# Patient Record
Sex: Female | Born: 1965 | Hispanic: No | Marital: Married | Smoking: Never smoker
Health system: Southern US, Community
[De-identification: ages and names within clinical notes are randomized; demographics above are authoritative.]

## PROBLEM LIST (undated history)

## (undated) DIAGNOSIS — M1990 Unspecified osteoarthritis, unspecified site: Secondary | ICD-10-CM

## (undated) HISTORY — PX: AUGMENTATION MAMMAPLASTY: SUR837

## (undated) HISTORY — PX: BREAST BIOPSY: SHX20

---

## 1998-10-05 ENCOUNTER — Emergency Department (HOSPITAL_COMMUNITY): Admission: EM | Admit: 1998-10-05 | Discharge: 1998-10-05 | Payer: Self-pay | Admitting: Emergency Medicine

## 2001-06-27 ENCOUNTER — Emergency Department (HOSPITAL_COMMUNITY): Admission: EM | Admit: 2001-06-27 | Discharge: 2001-06-27 | Payer: Self-pay | Admitting: Emergency Medicine

## 2001-06-27 ENCOUNTER — Encounter: Payer: Self-pay | Admitting: Emergency Medicine

## 2002-05-29 ENCOUNTER — Other Ambulatory Visit: Admission: RE | Admit: 2002-05-29 | Discharge: 2002-05-29 | Payer: Self-pay | Admitting: Obstetrics and Gynecology

## 2003-06-29 ENCOUNTER — Other Ambulatory Visit: Admission: RE | Admit: 2003-06-29 | Discharge: 2003-06-29 | Payer: Self-pay | Admitting: Obstetrics and Gynecology

## 2004-07-28 ENCOUNTER — Other Ambulatory Visit: Admission: RE | Admit: 2004-07-28 | Discharge: 2004-07-28 | Payer: Self-pay | Admitting: Obstetrics and Gynecology

## 2014-03-05 ENCOUNTER — Other Ambulatory Visit: Payer: Self-pay | Admitting: Obstetrics and Gynecology

## 2014-03-05 DIAGNOSIS — R928 Other abnormal and inconclusive findings on diagnostic imaging of breast: Secondary | ICD-10-CM

## 2014-03-23 ENCOUNTER — Ambulatory Visit
Admission: RE | Admit: 2014-03-23 | Discharge: 2014-03-23 | Disposition: A | Discharge status: Home / Self Care | Payer: BC Managed Care – PPO | Source: Ambulatory Visit | Attending: Obstetrics and Gynecology | Admitting: Obstetrics and Gynecology

## 2014-03-23 ENCOUNTER — Other Ambulatory Visit: Payer: Self-pay | Admitting: Obstetrics and Gynecology

## 2014-03-23 DIAGNOSIS — R928 Other abnormal and inconclusive findings on diagnostic imaging of breast: Secondary | ICD-10-CM

## 2014-03-29 ENCOUNTER — Other Ambulatory Visit: Payer: Self-pay | Admitting: Obstetrics and Gynecology

## 2014-03-29 DIAGNOSIS — R928 Other abnormal and inconclusive findings on diagnostic imaging of breast: Secondary | ICD-10-CM

## 2014-04-01 ENCOUNTER — Inpatient Hospital Stay: Admission: RE | Admit: 2014-04-01 | Payer: BC Managed Care – PPO | Source: Ambulatory Visit

## 2014-04-14 ENCOUNTER — Ambulatory Visit
Admission: RE | Admit: 2014-04-14 | Discharge: 2014-04-14 | Disposition: A | Discharge status: Home / Self Care | Payer: BC Managed Care – PPO | Source: Ambulatory Visit | Attending: Obstetrics and Gynecology | Admitting: Obstetrics and Gynecology

## 2014-04-14 DIAGNOSIS — R928 Other abnormal and inconclusive findings on diagnostic imaging of breast: Secondary | ICD-10-CM

## 2014-09-02 ENCOUNTER — Other Ambulatory Visit: Payer: Self-pay | Admitting: Obstetrics and Gynecology

## 2014-09-03 LAB — CYTOLOGY - PAP

## 2015-05-06 ENCOUNTER — Other Ambulatory Visit: Payer: Self-pay | Admitting: Obstetrics and Gynecology

## 2015-05-06 DIAGNOSIS — D241 Benign neoplasm of right breast: Secondary | ICD-10-CM

## 2015-05-13 ENCOUNTER — Ambulatory Visit
Admission: RE | Admit: 2015-05-13 | Discharge: 2015-05-13 | Disposition: A | Discharge status: Home / Self Care | Payer: BLUE CROSS/BLUE SHIELD | Source: Ambulatory Visit | Attending: Obstetrics and Gynecology | Admitting: Obstetrics and Gynecology

## 2015-05-13 DIAGNOSIS — D241 Benign neoplasm of right breast: Secondary | ICD-10-CM

## 2015-09-01 DIAGNOSIS — M26609 Unspecified temporomandibular joint disorder, unspecified side: Secondary | ICD-10-CM | POA: Diagnosis not present

## 2016-06-12 DIAGNOSIS — Z1231 Encounter for screening mammogram for malignant neoplasm of breast: Secondary | ICD-10-CM | POA: Diagnosis not present

## 2016-06-12 DIAGNOSIS — Z6825 Body mass index (BMI) 25.0-25.9, adult: Secondary | ICD-10-CM | POA: Diagnosis not present

## 2016-06-12 DIAGNOSIS — Z01419 Encounter for gynecological examination (general) (routine) without abnormal findings: Secondary | ICD-10-CM | POA: Diagnosis not present

## 2016-11-29 DIAGNOSIS — M545 Low back pain: Secondary | ICD-10-CM | POA: Diagnosis not present

## 2017-06-17 DIAGNOSIS — Z1231 Encounter for screening mammogram for malignant neoplasm of breast: Secondary | ICD-10-CM | POA: Diagnosis not present

## 2017-06-17 DIAGNOSIS — Z01419 Encounter for gynecological examination (general) (routine) without abnormal findings: Secondary | ICD-10-CM | POA: Diagnosis not present

## 2017-06-17 DIAGNOSIS — Z6826 Body mass index (BMI) 26.0-26.9, adult: Secondary | ICD-10-CM | POA: Diagnosis not present

## 2017-07-15 DIAGNOSIS — M791 Myalgia, unspecified site: Secondary | ICD-10-CM | POA: Diagnosis not present

## 2017-07-15 DIAGNOSIS — R5383 Other fatigue: Secondary | ICD-10-CM | POA: Diagnosis not present

## 2017-07-15 DIAGNOSIS — M2550 Pain in unspecified joint: Secondary | ICD-10-CM | POA: Diagnosis not present

## 2017-07-17 DIAGNOSIS — H5203 Hypermetropia, bilateral: Secondary | ICD-10-CM | POA: Diagnosis not present

## 2018-01-21 DIAGNOSIS — M25532 Pain in left wrist: Secondary | ICD-10-CM | POA: Diagnosis not present

## 2018-01-21 DIAGNOSIS — M25531 Pain in right wrist: Secondary | ICD-10-CM | POA: Diagnosis not present

## 2018-01-21 DIAGNOSIS — M545 Low back pain: Secondary | ICD-10-CM | POA: Diagnosis not present

## 2018-04-28 DIAGNOSIS — M25531 Pain in right wrist: Secondary | ICD-10-CM | POA: Diagnosis not present

## 2018-04-28 DIAGNOSIS — M545 Low back pain: Secondary | ICD-10-CM | POA: Diagnosis not present

## 2018-04-28 DIAGNOSIS — M25532 Pain in left wrist: Secondary | ICD-10-CM | POA: Diagnosis not present

## 2018-06-06 ENCOUNTER — Encounter (INDEPENDENT_AMBULATORY_CARE_PROVIDER_SITE_OTHER): Payer: BLUE CROSS/BLUE SHIELD | Admitting: Neurology

## 2018-06-06 ENCOUNTER — Ambulatory Visit: Payer: BLUE CROSS/BLUE SHIELD | Admitting: Neurology

## 2018-06-06 DIAGNOSIS — M79641 Pain in right hand: Secondary | ICD-10-CM | POA: Diagnosis not present

## 2018-06-06 DIAGNOSIS — M79642 Pain in left hand: Secondary | ICD-10-CM

## 2018-06-06 DIAGNOSIS — Z0289 Encounter for other administrative examinations: Secondary | ICD-10-CM

## 2018-06-06 NOTE — Procedures (Signed)
Full Name: Marisa Bates Gender: Female MRN #: [id number] Date of Birth: 03/07/1966    Visit Date: 06/06/2018 10:47 Age: 53 Years 3 Months Old Examining Physician: Marcial Pacas, MD  Referring Physician: Jacelyn Grip, MD History: 53 years old female with bilateral thumb base tenderness, intermittent numbness,  Summary of the tests:  Nerve conduction study: Right median, ulnar, left median sensory responses were normal. Left median, right ulnar motor responses were normal. 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    ------------------------------- Marcial Pacas, M.D. PhD  [facility] [place], [place] [address] Tel: [phone] Fax: [phone]        [facility]    Nerve / Sites Muscle Latency Ref. Amplitude Ref. Rel Amp Segments Distance Velocity Ref. Area    ms ms mV mV %  cm m/s m/s mVms  R Median - APB     Wrist APB 3.8 ?4.4 4.8 ?4.0 100 Wrist - APB 7   16.4     Upper arm APB 7.4  6.2  130 Upper arm - Wrist 20 56 ?49 21.1     Axilla APB 4.4  2.0  33.1 Axilla - Upper arm    5.4     Upper arm       Axilla - Wrist      L Median - APB     Wrist APB 3.4 ?4.4 5.8 ?4.0 100 Wrist - APB 7   18.1     Upper arm APB 6.8  5.1  88.4 Upper arm - Wrist 20 58 ?49 17.0  R Ulnar - ADM     Wrist ADM 2.3 ?3.3 11.0 ?6.0 100 Wrist - ADM 7   26.0     B.Elbow ADM 4.9  7.4  67.3 B.Elbow - Wrist 16 63 ?49 20.1     A.Elbow ADM 6.5  7.0  95.3 A.Elbow - B.Elbow 10 64 ?49 19.3         A.Elbow - Wrist               SNC    Nerve / Sites Rec. Site Peak Lat Ref.  Amp Ref. Segments Distance    ms ms V V  cm  R Median - Orthodromic (Dig II, Mid palm)     Dig II Wrist 3.3 ?3.4 12 ?10 Dig II - Wrist 13  L Median - Orthodromic (Dig II, Mid palm)     Dig II Wrist 3.2 ?3.4  13 ?10 Dig II - Wrist 13  R Ulnar - Orthodromic, (Dig V, Mid palm)     Dig V Wrist 2.3 ?3.1 8 ?5 Dig V - Wrist 72           F  Wave    Nerve F Lat Ref.   ms ms  R Ulnar - ADM 22.7 ?32.0       EMG       EMG Summary Table    Spontaneous MUAP Recruitment  Muscle IA Fib PSW Fasc Other Amp Dur. Poly Pattern  R. First dorsal interosseous Normal None None None _______ Normal Normal Normal Normal  R. Abductor pollicis brevis Normal None None None _______ Normal Normal Normal Normal  R. Pronator teres Normal None None None _______ Normal Normal Normal Normal  R. Biceps brachii Normal None None None  _______ Normal Normal Normal Normal  R. Deltoid Normal None None None _______ Normal Normal Normal Normal  R. Triceps brachii Normal None None None _______ Normal Normal Normal Normal

## 2018-06-30 DIAGNOSIS — J0100 Acute maxillary sinusitis, unspecified: Secondary | ICD-10-CM | POA: Diagnosis not present

## 2018-07-07 DIAGNOSIS — Z6827 Body mass index (BMI) 27.0-27.9, adult: Secondary | ICD-10-CM | POA: Diagnosis not present

## 2018-07-07 DIAGNOSIS — Z01419 Encounter for gynecological examination (general) (routine) without abnormal findings: Secondary | ICD-10-CM | POA: Diagnosis not present

## 2018-07-07 DIAGNOSIS — Z1231 Encounter for screening mammogram for malignant neoplasm of breast: Secondary | ICD-10-CM | POA: Diagnosis not present

## 2018-11-11 DIAGNOSIS — R6883 Chills (without fever): Secondary | ICD-10-CM | POA: Diagnosis not present

## 2019-07-21 DIAGNOSIS — M545 Low back pain: Secondary | ICD-10-CM | POA: Diagnosis not present

## 2019-07-30 DIAGNOSIS — M545 Low back pain: Secondary | ICD-10-CM | POA: Diagnosis not present

## 2019-08-22 DIAGNOSIS — M48062 Spinal stenosis, lumbar region with neurogenic claudication: Secondary | ICD-10-CM | POA: Diagnosis not present

## 2019-09-04 DIAGNOSIS — M545 Low back pain: Secondary | ICD-10-CM | POA: Diagnosis not present

## 2019-09-04 DIAGNOSIS — M48062 Spinal stenosis, lumbar region with neurogenic claudication: Secondary | ICD-10-CM | POA: Diagnosis not present

## 2019-09-25 DIAGNOSIS — G894 Chronic pain syndrome: Secondary | ICD-10-CM | POA: Diagnosis not present

## 2019-09-25 DIAGNOSIS — M5136 Other intervertebral disc degeneration, lumbar region: Secondary | ICD-10-CM | POA: Diagnosis not present

## 2019-10-15 DIAGNOSIS — Z01419 Encounter for gynecological examination (general) (routine) without abnormal findings: Secondary | ICD-10-CM | POA: Diagnosis not present

## 2019-10-15 DIAGNOSIS — R635 Abnormal weight gain: Secondary | ICD-10-CM | POA: Diagnosis not present

## 2019-10-15 DIAGNOSIS — Z1231 Encounter for screening mammogram for malignant neoplasm of breast: Secondary | ICD-10-CM | POA: Diagnosis not present

## 2019-10-15 DIAGNOSIS — Z6827 Body mass index (BMI) 27.0-27.9, adult: Secondary | ICD-10-CM | POA: Diagnosis not present

## 2019-10-17 DIAGNOSIS — M5136 Other intervertebral disc degeneration, lumbar region: Secondary | ICD-10-CM | POA: Diagnosis not present

## 2019-11-02 DIAGNOSIS — M545 Low back pain: Secondary | ICD-10-CM | POA: Diagnosis not present

## 2019-11-14 DIAGNOSIS — M47816 Spondylosis without myelopathy or radiculopathy, lumbar region: Secondary | ICD-10-CM | POA: Diagnosis not present

## 2020-07-11 DIAGNOSIS — M545 Low back pain, unspecified: Secondary | ICD-10-CM | POA: Diagnosis not present

## 2020-07-29 DIAGNOSIS — M47816 Spondylosis without myelopathy or radiculopathy, lumbar region: Secondary | ICD-10-CM | POA: Diagnosis not present

## 2020-07-29 DIAGNOSIS — M5136 Other intervertebral disc degeneration, lumbar region: Secondary | ICD-10-CM | POA: Diagnosis not present

## 2020-08-15 DIAGNOSIS — M9903 Segmental and somatic dysfunction of lumbar region: Secondary | ICD-10-CM | POA: Diagnosis not present

## 2020-08-15 DIAGNOSIS — M9906 Segmental and somatic dysfunction of lower extremity: Secondary | ICD-10-CM | POA: Diagnosis not present

## 2020-08-15 DIAGNOSIS — M9902 Segmental and somatic dysfunction of thoracic region: Secondary | ICD-10-CM | POA: Diagnosis not present

## 2020-08-15 DIAGNOSIS — M9901 Segmental and somatic dysfunction of cervical region: Secondary | ICD-10-CM | POA: Diagnosis not present

## 2020-08-16 DIAGNOSIS — M9903 Segmental and somatic dysfunction of lumbar region: Secondary | ICD-10-CM | POA: Diagnosis not present

## 2020-08-16 DIAGNOSIS — M9902 Segmental and somatic dysfunction of thoracic region: Secondary | ICD-10-CM | POA: Diagnosis not present

## 2020-08-16 DIAGNOSIS — M9906 Segmental and somatic dysfunction of lower extremity: Secondary | ICD-10-CM | POA: Diagnosis not present

## 2020-08-16 DIAGNOSIS — M9901 Segmental and somatic dysfunction of cervical region: Secondary | ICD-10-CM | POA: Diagnosis not present

## 2020-08-19 DIAGNOSIS — M9906 Segmental and somatic dysfunction of lower extremity: Secondary | ICD-10-CM | POA: Diagnosis not present

## 2020-08-19 DIAGNOSIS — M9901 Segmental and somatic dysfunction of cervical region: Secondary | ICD-10-CM | POA: Diagnosis not present

## 2020-08-19 DIAGNOSIS — M9903 Segmental and somatic dysfunction of lumbar region: Secondary | ICD-10-CM | POA: Diagnosis not present

## 2020-08-19 DIAGNOSIS — M9902 Segmental and somatic dysfunction of thoracic region: Secondary | ICD-10-CM | POA: Diagnosis not present

## 2020-08-22 DIAGNOSIS — M9902 Segmental and somatic dysfunction of thoracic region: Secondary | ICD-10-CM | POA: Diagnosis not present

## 2020-08-22 DIAGNOSIS — M9906 Segmental and somatic dysfunction of lower extremity: Secondary | ICD-10-CM | POA: Diagnosis not present

## 2020-08-22 DIAGNOSIS — M9901 Segmental and somatic dysfunction of cervical region: Secondary | ICD-10-CM | POA: Diagnosis not present

## 2020-08-22 DIAGNOSIS — M9903 Segmental and somatic dysfunction of lumbar region: Secondary | ICD-10-CM | POA: Diagnosis not present

## 2020-08-23 DIAGNOSIS — M9906 Segmental and somatic dysfunction of lower extremity: Secondary | ICD-10-CM | POA: Diagnosis not present

## 2020-08-23 DIAGNOSIS — M9903 Segmental and somatic dysfunction of lumbar region: Secondary | ICD-10-CM | POA: Diagnosis not present

## 2020-08-23 DIAGNOSIS — M9901 Segmental and somatic dysfunction of cervical region: Secondary | ICD-10-CM | POA: Diagnosis not present

## 2020-08-23 DIAGNOSIS — M9902 Segmental and somatic dysfunction of thoracic region: Secondary | ICD-10-CM | POA: Diagnosis not present

## 2020-08-26 DIAGNOSIS — M9902 Segmental and somatic dysfunction of thoracic region: Secondary | ICD-10-CM | POA: Diagnosis not present

## 2020-08-26 DIAGNOSIS — M9906 Segmental and somatic dysfunction of lower extremity: Secondary | ICD-10-CM | POA: Diagnosis not present

## 2020-08-26 DIAGNOSIS — M9903 Segmental and somatic dysfunction of lumbar region: Secondary | ICD-10-CM | POA: Diagnosis not present

## 2020-08-26 DIAGNOSIS — M9901 Segmental and somatic dysfunction of cervical region: Secondary | ICD-10-CM | POA: Diagnosis not present

## 2020-08-29 DIAGNOSIS — M9903 Segmental and somatic dysfunction of lumbar region: Secondary | ICD-10-CM | POA: Diagnosis not present

## 2020-08-29 DIAGNOSIS — M9902 Segmental and somatic dysfunction of thoracic region: Secondary | ICD-10-CM | POA: Diagnosis not present

## 2020-08-29 DIAGNOSIS — M9901 Segmental and somatic dysfunction of cervical region: Secondary | ICD-10-CM | POA: Diagnosis not present

## 2020-08-29 DIAGNOSIS — M9906 Segmental and somatic dysfunction of lower extremity: Secondary | ICD-10-CM | POA: Diagnosis not present

## 2020-08-30 DIAGNOSIS — M9903 Segmental and somatic dysfunction of lumbar region: Secondary | ICD-10-CM | POA: Diagnosis not present

## 2020-08-30 DIAGNOSIS — M9901 Segmental and somatic dysfunction of cervical region: Secondary | ICD-10-CM | POA: Diagnosis not present

## 2020-08-30 DIAGNOSIS — M9902 Segmental and somatic dysfunction of thoracic region: Secondary | ICD-10-CM | POA: Diagnosis not present

## 2020-08-30 DIAGNOSIS — M9906 Segmental and somatic dysfunction of lower extremity: Secondary | ICD-10-CM | POA: Diagnosis not present

## 2020-09-02 DIAGNOSIS — M9901 Segmental and somatic dysfunction of cervical region: Secondary | ICD-10-CM | POA: Diagnosis not present

## 2020-09-02 DIAGNOSIS — M9906 Segmental and somatic dysfunction of lower extremity: Secondary | ICD-10-CM | POA: Diagnosis not present

## 2020-09-02 DIAGNOSIS — M9902 Segmental and somatic dysfunction of thoracic region: Secondary | ICD-10-CM | POA: Diagnosis not present

## 2020-09-02 DIAGNOSIS — M9903 Segmental and somatic dysfunction of lumbar region: Secondary | ICD-10-CM | POA: Diagnosis not present

## 2020-09-05 DIAGNOSIS — M9901 Segmental and somatic dysfunction of cervical region: Secondary | ICD-10-CM | POA: Diagnosis not present

## 2020-09-05 DIAGNOSIS — M9903 Segmental and somatic dysfunction of lumbar region: Secondary | ICD-10-CM | POA: Diagnosis not present

## 2020-09-05 DIAGNOSIS — M9902 Segmental and somatic dysfunction of thoracic region: Secondary | ICD-10-CM | POA: Diagnosis not present

## 2020-09-05 DIAGNOSIS — M9906 Segmental and somatic dysfunction of lower extremity: Secondary | ICD-10-CM | POA: Diagnosis not present

## 2020-09-06 DIAGNOSIS — M9903 Segmental and somatic dysfunction of lumbar region: Secondary | ICD-10-CM | POA: Diagnosis not present

## 2020-09-06 DIAGNOSIS — M9901 Segmental and somatic dysfunction of cervical region: Secondary | ICD-10-CM | POA: Diagnosis not present

## 2020-09-06 DIAGNOSIS — M9906 Segmental and somatic dysfunction of lower extremity: Secondary | ICD-10-CM | POA: Diagnosis not present

## 2020-09-06 DIAGNOSIS — M9902 Segmental and somatic dysfunction of thoracic region: Secondary | ICD-10-CM | POA: Diagnosis not present

## 2020-09-12 DIAGNOSIS — M9901 Segmental and somatic dysfunction of cervical region: Secondary | ICD-10-CM | POA: Diagnosis not present

## 2020-09-12 DIAGNOSIS — M9902 Segmental and somatic dysfunction of thoracic region: Secondary | ICD-10-CM | POA: Diagnosis not present

## 2020-09-12 DIAGNOSIS — M9903 Segmental and somatic dysfunction of lumbar region: Secondary | ICD-10-CM | POA: Diagnosis not present

## 2020-09-12 DIAGNOSIS — M9906 Segmental and somatic dysfunction of lower extremity: Secondary | ICD-10-CM | POA: Diagnosis not present

## 2020-09-16 DIAGNOSIS — M9902 Segmental and somatic dysfunction of thoracic region: Secondary | ICD-10-CM | POA: Diagnosis not present

## 2020-09-16 DIAGNOSIS — M9906 Segmental and somatic dysfunction of lower extremity: Secondary | ICD-10-CM | POA: Diagnosis not present

## 2020-09-16 DIAGNOSIS — M9903 Segmental and somatic dysfunction of lumbar region: Secondary | ICD-10-CM | POA: Diagnosis not present

## 2020-09-16 DIAGNOSIS — M9901 Segmental and somatic dysfunction of cervical region: Secondary | ICD-10-CM | POA: Diagnosis not present

## 2020-09-19 DIAGNOSIS — M9902 Segmental and somatic dysfunction of thoracic region: Secondary | ICD-10-CM | POA: Diagnosis not present

## 2020-09-19 DIAGNOSIS — M9903 Segmental and somatic dysfunction of lumbar region: Secondary | ICD-10-CM | POA: Diagnosis not present

## 2020-09-19 DIAGNOSIS — M9906 Segmental and somatic dysfunction of lower extremity: Secondary | ICD-10-CM | POA: Diagnosis not present

## 2020-09-19 DIAGNOSIS — M9901 Segmental and somatic dysfunction of cervical region: Secondary | ICD-10-CM | POA: Diagnosis not present

## 2020-09-23 DIAGNOSIS — M5136 Other intervertebral disc degeneration, lumbar region: Secondary | ICD-10-CM | POA: Diagnosis not present

## 2020-09-23 DIAGNOSIS — M48062 Spinal stenosis, lumbar region with neurogenic claudication: Secondary | ICD-10-CM | POA: Diagnosis not present

## 2020-09-23 DIAGNOSIS — M47816 Spondylosis without myelopathy or radiculopathy, lumbar region: Secondary | ICD-10-CM | POA: Diagnosis not present

## 2020-09-26 DIAGNOSIS — M9906 Segmental and somatic dysfunction of lower extremity: Secondary | ICD-10-CM | POA: Diagnosis not present

## 2020-09-26 DIAGNOSIS — M9902 Segmental and somatic dysfunction of thoracic region: Secondary | ICD-10-CM | POA: Diagnosis not present

## 2020-09-26 DIAGNOSIS — M9901 Segmental and somatic dysfunction of cervical region: Secondary | ICD-10-CM | POA: Diagnosis not present

## 2020-09-26 DIAGNOSIS — M9903 Segmental and somatic dysfunction of lumbar region: Secondary | ICD-10-CM | POA: Diagnosis not present

## 2020-09-30 DIAGNOSIS — M9903 Segmental and somatic dysfunction of lumbar region: Secondary | ICD-10-CM | POA: Diagnosis not present

## 2020-09-30 DIAGNOSIS — M9902 Segmental and somatic dysfunction of thoracic region: Secondary | ICD-10-CM | POA: Diagnosis not present

## 2020-09-30 DIAGNOSIS — M9906 Segmental and somatic dysfunction of lower extremity: Secondary | ICD-10-CM | POA: Diagnosis not present

## 2020-09-30 DIAGNOSIS — M9901 Segmental and somatic dysfunction of cervical region: Secondary | ICD-10-CM | POA: Diagnosis not present

## 2020-11-01 ENCOUNTER — Other Ambulatory Visit: Payer: Self-pay

## 2020-11-01 ENCOUNTER — Emergency Department: Payer: BC Managed Care – PPO

## 2020-11-01 ENCOUNTER — Encounter: Payer: Self-pay | Admitting: *Deleted

## 2020-11-01 ENCOUNTER — Emergency Department
Admission: EM | Admit: 2020-11-01 | Discharge: 2020-11-02 | Disposition: A | Discharge status: Home / Self Care | Payer: BC Managed Care – PPO | Attending: Emergency Medicine | Admitting: Emergency Medicine

## 2020-11-01 DIAGNOSIS — R531 Weakness: Secondary | ICD-10-CM | POA: Diagnosis not present

## 2020-11-01 DIAGNOSIS — R0602 Shortness of breath: Secondary | ICD-10-CM | POA: Diagnosis not present

## 2020-11-01 DIAGNOSIS — U071 COVID-19: Secondary | ICD-10-CM | POA: Insufficient documentation

## 2020-11-01 DIAGNOSIS — R059 Cough, unspecified: Secondary | ICD-10-CM | POA: Diagnosis not present

## 2020-11-01 LAB — CBC
HCT: 43.2 % (ref 36.0–46.0)
Hemoglobin: 14.8 g/dL (ref 12.0–15.0)
MCH: 32.1 pg (ref 26.0–34.0)
MCHC: 34.3 g/dL (ref 30.0–36.0)
MCV: 93.7 fL (ref 80.0–100.0)
Platelets: 290 10*3/uL (ref 150–400)
RBC: 4.61 MIL/uL (ref 3.87–5.11)
RDW: 12.4 % (ref 11.5–15.5)
WBC: 6.3 10*3/uL (ref 4.0–10.5)
nRBC: 0 % (ref 0.0–0.2)

## 2020-11-01 LAB — BASIC METABOLIC PANEL
Anion gap: 7 (ref 5–15)
BUN: 13 mg/dL (ref 6–20)
CO2: 27 mmol/L (ref 22–32)
Calcium: 9.5 mg/dL (ref 8.9–10.3)
Chloride: 100 mmol/L (ref 98–111)
Creatinine, Ser: 0.66 mg/dL (ref 0.44–1.00)
GFR, Estimated: >60 mL/min (ref >60)
Glucose, Bld: 104 mg/dL — ABNORMAL HIGH (ref 70–99)
Potassium: 5 mmol/L (ref 3.5–5.1)
Sodium: 134 mmol/L — ABNORMAL LOW (ref 135–145)

## 2020-11-01 LAB — TROPONIN I (HIGH SENSITIVITY)
Troponin I (High Sensitivity): 3 ng/L (ref <18)
Troponin I (High Sensitivity): 6 ng/L (ref <18)

## 2020-11-01 MED ORDER — ACETAMINOPHEN 500 MG PO TABS: 1000.0000 mg | ORAL_TABLET | Freq: Once | ORAL | Status: DC

## 2020-11-01 MED ORDER — ALBUTEROL SULFATE HFA 108 (90 BASE) MCG/ACT IN AERS
2.0000 | INHALATION_SPRAY | RESPIRATORY_TRACT | Status: DC
  Filled 2020-11-01: qty 6.7

## 2020-11-01 NOTE — ED Triage Notes (Signed)
Pt reports positive home covid test ane sob this week.  Pt also has chest pain.  Pt has a cough.  Fever today   Pt alert speech clear.

## 2020-11-02 LAB — D-DIMER, QUANTITATIVE: D-Dimer, Quant: 0.31 ug/mL-FEU (ref 0.00–0.50)

## 2020-11-02 MED ORDER — NIRMATRELVIR/RITONAVIR (PAXLOVID)TABLET: 3.0000 | ORAL_TABLET | Freq: Two times a day (BID) | ORAL | 0 refills | Status: AC

## 2020-11-02 NOTE — ED Provider Notes (Signed)
[facility] Emergency Department Provider Note  ____________________________________________  Time seen: Approximately 1:05 AM  I have reviewed the triage vital signs and the nursing notes.   HISTORY  Chief Complaint Shortness of Breath   HPI Marisa Bates is a 55 y.o. female no significant past medical history who presents for evaluation of COVID-like symptoms.  Patient reports this is day 4 of her symptoms.  She tested positive at home.  She reports body aches, chills, cough, generalized weakness and fatigue.  This evening she started having chest pain which prompted her visit to the emergency room.  She reports the chest pain is intermittent tightness in her chest.  She denies shortness of breath.  She denies personal or family history of PE or DVT, recent travel immobilization, leg pain or swelling, hemoptysis, or exogenous hormones.   No history of smoking, COPD, or asthma.  No chest pain at this time  No past medical history on file.  There are no problems to display for this patient.  Allergies Patient has no known allergies.  No family history on file.  Social History Social History   Tobacco Use  . Smoking status: Never Smoker  . Smokeless tobacco: Never Used    Review of Systems  Constitutional: + fever, chills, myalgias Eyes: Negative for visual changes. ENT: Negative for sore throat. Neck: No neck pain  Cardiovascular: + chest pain. Respiratory: Negative for shortness of breath. Gastrointestinal: Negative for abdominal pain, vomiting or diarrhea. Genitourinary: Negative for dysuria. Musculoskeletal: Negative for back pain. Skin: Negative for rash. Neurological: Negative for headaches, weakness or numbness. Psych: No SI or HI  ____________________________________________   PHYSICAL EXAM:  VITAL SIGNS: Vitals:   11/01/20 2233 11/02/20 0025  BP: 131/90 111/81  Pulse: (!) 105 (!) 103  Resp: 18 18  Temp: 99.6 F (37.6 C)    SpO2: 96% 96%    Constitutional: Alert and oriented. Well appearing and in no apparent distress. HEENT:      Head: Normocephalic and atraumatic.         Eyes: Conjunctivae are normal. Sclera is non-icteric.       Mouth/Throat: Mucous membranes are moist.       Neck: Supple with no signs of meningismus. Cardiovascular: Regular rate and rhythm. No murmurs, gallops, or rubs. 2+ symmetrical distal pulses are present in all extremities. No JVD. Respiratory: Normal respiratory effort. Lungs are clear to auscultation bilaterally.  Gastrointestinal: Soft, non tender. Musculoskeletal:  No edema, cyanosis, or erythema of extremities. Neurologic: Normal speech and language. Face is symmetric. Moving all extremities. No gross focal neurologic deficits are appreciated. Skin: Skin is warm, dry and intact. No rash noted. Psychiatric: Mood and affect are normal. Speech and behavior are normal.  ____________________________________________   LABS (all labs ordered are listed, but only abnormal results are displayed)  Labs Reviewed  BASIC METABOLIC PANEL - Abnormal; Notable for the following components:      Result Value   Sodium 134 (*)    Glucose, Bld 104 (*)    All other components within normal limits  CBC  D-DIMER, QUANTITATIVE  POC URINE PREG, ED  TROPONIN I (HIGH SENSITIVITY)  TROPONIN I (HIGH SENSITIVITY)   ____________________________________________  EKG  ED ECG REPORT I, Rudene Re, the attending physician, personally viewed and interpreted this ECG.  Sinus tachycardia, rate of 117, normal intervals, normal axis, no ST elevations or depressions ____________________________________________  RADIOLOGY  I have personally reviewed the images performed during this visit and I  agree with the Radiologist's read.   Interpretation by Radiologist:  DG Chest 2 View  Result Date: 11/01/2020 CLINICAL DATA:  Shortness of breath.  Positive COVID test at home. EXAM: CHEST - 2  VIEW COMPARISON:  None. FINDINGS: The heart size and mediastinal contours are within normal limits. Both lungs are clear. The visualized skeletal structures are unremarkable. IMPRESSION: No active cardiopulmonary disease. Electronically Signed   By: Kerby Moors M.D.   On: 11/01/2020 21:03     ____________________________________________   PROCEDURES  Procedure(s) performed:yes .1-3 Lead EKG Interpretation Performed by: Rudene Re, MD Authorized by: Rudene Re, MD     Interpretation: non-specific     ECG rate assessment: tachycardic     Rhythm: sinus tachycardia     Ectopy: none     Conduction: normal     Critical Care performed:  None ____________________________________________   INITIAL IMPRESSION / ASSESSMENT AND PLAN / ED COURSE   55 y.o. female no significant past medical history who presents for evaluation of COVID-like symptoms including myalgias, fever, chills, fatigue, cough and now intermittent chest tightness.  Patient is well-appearing in no distress.  Slightly tachycardic with a low-grade temp.  No hypoxia both at rest and with ambulation, lungs are clear to auscultation bilaterally.  Chest x-ray visualized by me with no signs of COVID PNA.  EKG with no signs of pericarditis.  2 high-sensitivity troponin is negative with no signs of myocarditis.  D-dimer negative to rule out PE.  Possible intermittent bronchospasms in the setting of COVID.  Will discharge home with an albuterol inhaler and a prescription for Paxlovid.  Discussed quarantine, immune system boosting, follow-up with PCP, oxygen monitoring, and my standard return precautions.        _____________________________________________ Please note:  Patient was evaluated in Emergency Department today for the symptoms described in the history of present illness. Patient was evaluated in the context of the global COVID-19 pandemic, which necessitated consideration that the patient might be at risk  for infection with the SARS-CoV-2 virus that causes COVID-19. Institutional protocols and algorithms that pertain to the evaluation of patients at risk for COVID-19 are in a state of rapid change based on information released by regulatory bodies including the CDC and federal and state organizations. These policies and algorithms were followed during the patient's care in the ED.  Some ED evaluations and interventions may be delayed as a result of limited staffing during the pandemic.   [place] Controlled Substance Database was reviewed by me. ____________________________________________   FINAL CLINICAL IMPRESSION(S) / ED DIAGNOSES   Final diagnoses:  COVID-19      NEW MEDICATIONS STARTED DURING THIS VISIT:  ED Discharge Orders         Ordered    nirmatrelvir/ritonavir EUA (PAXLOVID) TABS  2 times daily        11/02/20 0112           Note:  This document was prepared using Dragon voice recognition software and may include unintentional dictation errors.    Alfred Levins, [place], MD 11/02/20 [phone]

## 2020-11-02 NOTE — Discharge Instructions (Signed)
To help boost your immune system against COVID-19, please take:  - Vitamin D3 4,000 IU/day - Vitamin C 500-1,000?mg twice a day - Quercetin 250?mg twice a day - Zinc 100?mg/day - Melatonin 10?mg before bedtime (causes drowsiness) - Aspirin 325?mg/day (unless contraindicated) - Pulse Oximeter Monitoring of oxygen saturation is recommended - check your oxygen 3 times a day if less than 90% return to the ER  These medications are all over-the-counter and do not need a prescription.  QUARANTINE INSTRUCTION  Follow these instructions at home:  Protecting others To avoid spreading the illness to other people: Quarantine in your home until you have had no cough and fever for 7 days. Household members should also be quarantine for at least 14 days after being exposed to you. Wash your hands often with soap and water. If soap and water are not available, use an alcohol-based hand sanitizer. If you have not cleaned your hands, do not touch your face. Make sure that all people in your household wash their hands well and often. Cover your nose and mouth when you cough or sneeze. Throw away used tissues. Stay home if you have any cold-like or flu-like symptoms. General instructions Go to your local pharmacy and buy a pulse oximeter (this is a machine that measures your oxygen). Check your oxygen levels at least 3 times a day. If your oxygen level is 90% or less return to the emergency room immediately Take over-the-counter and prescription medicines only as told by your health care provider. If you need medication for fever take tylenol or ibuprofen Drink enough fluid to keep your urine pale yellow. Rest at home as directed by your health care provider. Do not give aspirin to a child with the flu, because of the association with Reye's syndrome. Do not use tobacco products, including cigarettes, chewing tobacco, and e-cigarettes. If you need help quitting, ask your health care provider. Keep all  follow-up visits as told by your health care provider. This is important. How is this prevented? Avoid areas where an outbreak has been reported. Avoid large groups of people. Keep a safe distance from people who are coughing and sneezing. Do not touch your face if you have not cleaned your hands. When you are around people who are sick or might be sick, wear a mask to protect yourself. Contact a health care provider if: You have symptoms of SARS (cough, fever, chest pain, shortness of breath) that are not getting better at home. You have a fever. If you have difficulty breathing go to your local ER or call 911

## 2020-11-03 ENCOUNTER — Telehealth: Payer: Self-pay | Admitting: *Deleted

## 2020-11-03 ENCOUNTER — Ambulatory Visit: Payer: Self-pay | Admitting: *Deleted

## 2020-11-03 NOTE — Telephone Encounter (Signed)
Pt reports vomiting with Paxlovid, prescribed [facility] ED. States called PCP who told her to notify prescriber to change medication. Pt given ph# and name of prescriber. Instructed to CB for any issues. Pt verbalizes understanding.

## 2020-12-12 DIAGNOSIS — Z01419 Encounter for gynecological examination (general) (routine) without abnormal findings: Secondary | ICD-10-CM | POA: Diagnosis not present

## 2020-12-12 DIAGNOSIS — Z6826 Body mass index (BMI) 26.0-26.9, adult: Secondary | ICD-10-CM | POA: Diagnosis not present

## 2020-12-12 DIAGNOSIS — Z1231 Encounter for screening mammogram for malignant neoplasm of breast: Secondary | ICD-10-CM | POA: Diagnosis not present

## 2020-12-14 ENCOUNTER — Other Ambulatory Visit: Payer: Self-pay | Admitting: Obstetrics and Gynecology

## 2020-12-14 DIAGNOSIS — R928 Other abnormal and inconclusive findings on diagnostic imaging of breast: Secondary | ICD-10-CM

## 2020-12-30 ENCOUNTER — Ambulatory Visit
Admission: RE | Admit: 2020-12-30 | Discharge: 2020-12-30 | Disposition: A | Discharge status: Home / Self Care | Payer: BC Managed Care – PPO | Source: Ambulatory Visit | Attending: Obstetrics and Gynecology | Admitting: Obstetrics and Gynecology

## 2020-12-30 ENCOUNTER — Other Ambulatory Visit: Payer: Self-pay

## 2020-12-30 ENCOUNTER — Other Ambulatory Visit: Payer: Self-pay | Admitting: Obstetrics and Gynecology

## 2020-12-30 DIAGNOSIS — R922 Inconclusive mammogram: Secondary | ICD-10-CM | POA: Diagnosis not present

## 2020-12-30 DIAGNOSIS — R928 Other abnormal and inconclusive findings on diagnostic imaging of breast: Secondary | ICD-10-CM

## 2021-01-13 ENCOUNTER — Other Ambulatory Visit: Payer: Self-pay

## 2021-01-13 ENCOUNTER — Ambulatory Visit
Admission: RE | Admit: 2021-01-13 | Discharge: 2021-01-13 | Disposition: A | Discharge status: Home / Self Care | Payer: BC Managed Care – PPO | Source: Ambulatory Visit | Attending: Obstetrics and Gynecology | Admitting: Obstetrics and Gynecology

## 2021-01-13 DIAGNOSIS — N6489 Other specified disorders of breast: Secondary | ICD-10-CM | POA: Diagnosis not present

## 2021-01-13 DIAGNOSIS — R928 Other abnormal and inconclusive findings on diagnostic imaging of breast: Secondary | ICD-10-CM

## 2021-02-01 DIAGNOSIS — H40033 Anatomical narrow angle, bilateral: Secondary | ICD-10-CM | POA: Diagnosis not present

## 2021-02-01 DIAGNOSIS — H2513 Age-related nuclear cataract, bilateral: Secondary | ICD-10-CM | POA: Diagnosis not present

## 2021-02-17 ENCOUNTER — Other Ambulatory Visit: Payer: Self-pay | Admitting: General Surgery

## 2021-02-17 DIAGNOSIS — R928 Other abnormal and inconclusive findings on diagnostic imaging of breast: Secondary | ICD-10-CM

## 2021-02-20 ENCOUNTER — Other Ambulatory Visit: Payer: Self-pay | Admitting: General Surgery

## 2021-02-20 DIAGNOSIS — R928 Other abnormal and inconclusive findings on diagnostic imaging of breast: Secondary | ICD-10-CM

## 2021-04-06 ENCOUNTER — Ambulatory Visit: Admit: 2021-04-06 | Payer: BC Managed Care – PPO | Admitting: General Surgery

## 2021-04-06 SURGERY — RADIOACTIVE SEED GUIDED BREAST BIOPSY
Anesthesia: General | Site: Breast | Laterality: Right

## 2021-05-25 DIAGNOSIS — M545 Low back pain, unspecified: Secondary | ICD-10-CM | POA: Diagnosis not present

## 2021-05-25 DIAGNOSIS — M25562 Pain in left knee: Secondary | ICD-10-CM | POA: Diagnosis not present

## 2021-06-27 DIAGNOSIS — N3000 Acute cystitis without hematuria: Secondary | ICD-10-CM | POA: Diagnosis not present

## 2021-06-27 DIAGNOSIS — R300 Dysuria: Secondary | ICD-10-CM | POA: Diagnosis not present

## 2021-06-27 DIAGNOSIS — M25562 Pain in left knee: Secondary | ICD-10-CM | POA: Diagnosis not present

## 2021-06-27 DIAGNOSIS — G8929 Other chronic pain: Secondary | ICD-10-CM | POA: Diagnosis not present

## 2021-07-07 DIAGNOSIS — M170 Bilateral primary osteoarthritis of knee: Secondary | ICD-10-CM | POA: Diagnosis not present

## 2021-08-11 ENCOUNTER — Other Ambulatory Visit: Payer: Self-pay | Admitting: General Surgery

## 2021-08-11 DIAGNOSIS — R928 Other abnormal and inconclusive findings on diagnostic imaging of breast: Secondary | ICD-10-CM

## 2021-08-17 DIAGNOSIS — M5416 Radiculopathy, lumbar region: Secondary | ICD-10-CM | POA: Diagnosis not present

## 2021-08-29 ENCOUNTER — Other Ambulatory Visit: Payer: Self-pay | Admitting: General Surgery

## 2021-08-29 DIAGNOSIS — R928 Other abnormal and inconclusive findings on diagnostic imaging of breast: Secondary | ICD-10-CM

## 2021-08-31 DIAGNOSIS — M5416 Radiculopathy, lumbar region: Secondary | ICD-10-CM | POA: Diagnosis not present

## 2021-09-18 DIAGNOSIS — K648 Other hemorrhoids: Secondary | ICD-10-CM | POA: Diagnosis not present

## 2021-09-18 DIAGNOSIS — Z1211 Encounter for screening for malignant neoplasm of colon: Secondary | ICD-10-CM | POA: Diagnosis not present

## 2021-09-18 DIAGNOSIS — K5730 Diverticulosis of large intestine without perforation or abscess without bleeding: Secondary | ICD-10-CM | POA: Diagnosis not present

## 2021-09-25 DIAGNOSIS — M170 Bilateral primary osteoarthritis of knee: Secondary | ICD-10-CM | POA: Diagnosis not present

## 2021-09-27 ENCOUNTER — Other Ambulatory Visit: Payer: Self-pay | Admitting: General Surgery

## 2021-09-29 DIAGNOSIS — R928 Other abnormal and inconclusive findings on diagnostic imaging of breast: Secondary | ICD-10-CM | POA: Diagnosis not present

## 2021-10-04 NOTE — Pre-Procedure Instructions (Signed)
Surgical Instructions ? ? ? Your procedure is scheduled on Thursday, May 18th. ? Report to [facility] Main Entrance "A" at 12:00 P.M., then check in with the Admitting office. ? Call this number if you have problems the morning of surgery: ? [phone] ? ? If you have any questions prior to your surgery date call [phone]: Open Monday-Friday 8am-4pm ? ? ? Remember: ? Do not eat after midnight the night before your surgery ? ?You may drink clear liquids until 11:00 AM the morning of your surgery.   ?Clear liquids allowed are: Water, Non-Citrus Juices (without pulp), Carbonated Beverages, Clear Tea, Black Coffee Only (NO MILK, CREAM OR POWDERED CREAMER of any kind), and Gatorade. ? ? ?Patient Instructions ? ?The night before surgery:  ?No food after midnight. ONLY clear liquids after midnight ? ?The day of surgery (if you do NOT have diabetes):  ?Drink ONE (1) Pre-Surgery Clear Ensure by 11:00 AM the morning of surgery. Drink in one sitting. Do not sip.  ?This drink was given to you during your hospital  ?pre-op appointment visit. ? ?Nothing else to drink after completing the  ?Pre-Surgery Clear Ensure. ? ? ?       If you have questions, please contact your surgeon?s office.  ? ?  ? Take these medicines the morning of surgery with A SIP OF WATER : none ? ?As of today, STOP taking any Aspirin (unless otherwise instructed by your surgeon) Aleve, Naproxen, Ibuprofen, Motrin, Advil, Goody's, BC's, all herbal medications, fish oil, and all vitamins. This includes your meloxicam (MOBIC). ?         ?           ?Do NOT Smoke (Tobacco/Vaping) for 24 hours prior to your procedure. ? ?If you use a CPAP at night, you may bring your mask/headgear for your overnight stay. ?  ?Contacts, glasses, piercing's, hearing aid's, dentures or partials may not be worn into surgery, please bring cases for these belongings.  ?  ?For patients admitted to the hospital, discharge time will be determined by your treatment team. ?  ?Patients  discharged the day of surgery will not be allowed to drive home, and someone needs to stay with them for 24 hours. ? ?SURGICAL WAITING ROOM VISITATION ?Patients having surgery or a procedure may have two support people in the waiting room. These visitors may be switched out with other visitors if needed. ?Children under the age of 1 must have an adult accompany them who is not the patient. ?If the patient needs to stay at the hospital during part of their recovery, the visitor guidelines for inpatient rooms apply. ? ?Please refer to the [place] website for the visitor guidelines for Inpatients (after your surgery is over and you are in a regular room).  ? ? ?Special instructions:   ?Marisa Bates- Preparing For Surgery ? ?Before surgery, you can play an important role. Because skin is not sterile, your skin needs to be as free of germs as possible. You can reduce the number of germs on your skin by washing with CHG (chlorahexidine gluconate) Soap before surgery.  CHG is an antiseptic cleaner which kills germs and bonds with the skin to continue killing germs even after washing.   ? ?Oral Hygiene is also important to reduce your risk of infection.  Remember - BRUSH YOUR TEETH THE MORNING OF SURGERY WITH YOUR REGULAR TOOTHPASTE ? ?Please do not use if you have an allergy to CHG or antibacterial soaps. If your skin becomes  reddened/irritated stop using the CHG.  ?Do not shave (including legs and underarms) for at least 48 hours prior to first CHG shower. It is OK to shave your face. ? ?Please follow these instructions carefully. ?  ?Shower the NIGHT BEFORE SURGERY and the MORNING OF SURGERY ? ?If you chose to wash your hair, wash your hair first as usual with your normal shampoo. ? ?After you shampoo, rinse your hair and body thoroughly to remove the shampoo. ? ?Use CHG Soap as you would any other liquid soap. You can apply CHG directly to the skin and wash gently with a scrungie or a clean washcloth.  ? ?Apply the  CHG Soap to your body ONLY FROM THE NECK DOWN.  Do not use on open wounds or open sores. Avoid contact with your eyes, ears, mouth and genitals (private parts). Wash Face and genitals (private parts)  with your normal soap.  ? ?Wash thoroughly, paying special attention to the area where your surgery will be performed. ? ?Thoroughly rinse your body with warm water from the neck down. ? ?DO NOT shower/wash with your normal soap after using and rinsing off the CHG Soap. ? ?Pat yourself dry with a CLEAN TOWEL. ? ?Wear CLEAN PAJAMAS to bed the night before surgery ? ?Place CLEAN SHEETS on your bed the night before your surgery ? ?DO NOT SLEEP WITH PETS. ? ? ?Day of Surgery: ?Take a shower with CHG soap. ?Do not wear jewelry or makeup ?Do not wear lotions, powders, perfumes, or deodorant. ?Do not shave 48 hours prior to surgery.   ?Do not bring valuables to the hospital.  ?[facility] is not responsible for any belongings or valuables. ?Do not wear nail polish, gel polish, artificial nails, or any other type of covering on natural nails (fingers and toes) ?If you have artificial nails or gel coating that need to be removed by a nail salon, please have this removed prior to surgery. Artificial nails or gel coating may interfere with anesthesia's ability to adequately monitor your vital signs. ?Wear Clean/Comfortable clothing the morning of surgery ?Remember to brush your teeth WITH YOUR REGULAR TOOTHPASTE. ?  ?Please read over the following fact sheets that you were given. ? ? ? ?If you received a COVID test during your pre-op visit  it is requested that you wear a mask when out in public, stay away from anyone that may not be feeling well and notify your surgeon if you develop symptoms. If you have been in contact with anyone that has tested positive in the last 10 days please notify you surgeon.  ?

## 2021-10-05 ENCOUNTER — Encounter (HOSPITAL_COMMUNITY)
Admission: RE | Admit: 2021-10-05 | Discharge: 2021-10-05 | Disposition: A | Discharge status: Home / Self Care | Payer: BC Managed Care – PPO | Source: Ambulatory Visit | Attending: General Surgery | Admitting: General Surgery

## 2021-10-05 ENCOUNTER — Other Ambulatory Visit: Payer: Self-pay

## 2021-10-05 ENCOUNTER — Encounter (HOSPITAL_COMMUNITY): Payer: Self-pay

## 2021-10-05 VITALS — BP 123/88 | HR 96 | Temp 98.3°F | Resp 18 | Ht 62.0 in | Wt 148.2 lb

## 2021-10-05 DIAGNOSIS — R000 Tachycardia, unspecified: Secondary | ICD-10-CM | POA: Insufficient documentation

## 2021-10-05 DIAGNOSIS — R928 Other abnormal and inconclusive findings on diagnostic imaging of breast: Secondary | ICD-10-CM | POA: Insufficient documentation

## 2021-10-05 DIAGNOSIS — Z01818 Encounter for other preprocedural examination: Secondary | ICD-10-CM

## 2021-10-05 DIAGNOSIS — Z01812 Encounter for preprocedural laboratory examination: Secondary | ICD-10-CM | POA: Diagnosis not present

## 2021-10-05 HISTORY — DX: Unspecified osteoarthritis, unspecified site: M19.90

## 2021-10-05 LAB — CBC
HCT: 43.4 % (ref 36.0–46.0)
Hemoglobin: 14.4 g/dL (ref 12.0–15.0)
MCH: 31.9 pg (ref 26.0–34.0)
MCHC: 33.2 g/dL (ref 30.0–36.0)
MCV: 96.2 fL (ref 80.0–100.0)
Platelets: 325 10*3/uL (ref 150–400)
RBC: 4.51 MIL/uL (ref 3.87–5.11)
RDW: 11.9 % (ref 11.5–15.5)
WBC: 9.8 10*3/uL (ref 4.0–10.5)
nRBC: 0 % (ref 0.0–0.2)

## 2021-10-05 NOTE — Progress Notes (Signed)
PCP - [facility] (pt states she goes here sometimes but does not see a regular PCP)- records requested for last office note ? ?Cardiologist - denies ? ?PPM/ICD - denies ? ? ?Chest x-ray - 11/01/20 ?EKG - 11/01/20 ?Stress Test - denies ?ECHO - denies ?Cardiac Cath - denies ? ?Sleep Study - denies ? ?DM- denies ? ?ASA/Blood Thinner Instructions: n/a ? ? ?ERAS Protcol - yes ?PRE-SURGERY Ensure given at PAT ? ?COVID TEST- n/a ? ? ?Anesthesia review: yes, pt tachycardic at PAT visit. Advised by Levada Dy to send chart for review ? ?Patient denies shortness of breath, fever, cough and chest pain at PAT appointment ? ? ?All instructions explained to the patient, with a verbal understanding of the material. Patient agrees to go over the instructions while at home for a better understanding. Patient also instructed to notify surgeon of any contact with COVID+ person or if she develops any symptoms. The opportunity to ask questions was provided. ?  ?

## 2021-10-05 NOTE — Progress Notes (Signed)
Pt's HR initially at PAT visit was 114. Levada Dy, with Anesthesia, contacted and advised to recheck heart rate and have pt take deep breaths and relax. After visit, pt was asked to sit for a few minutes, relax and take deep breaths. Pt's HR went from 107 to 96 bpm. Pt denies any chest pain or shortness of breath.  ?

## 2021-10-06 NOTE — Anesthesia Preprocedure Evaluation (Addendum)
Anesthesia Evaluation  Patient identified by MRN, date of birth, ID band Patient awake    Reviewed: Allergy & Precautions, NPO status , Patient's Chart, lab work & pertinent test results  Airway Mallampati: I  TM Distance: >3 FB Neck ROM: Full    Dental  (+) Teeth Intact, Dental Advisory Given   Pulmonary neg pulmonary ROS,    breath sounds clear to auscultation       Cardiovascular negative cardio ROS   Rhythm:Regular Rate:Normal     Neuro/Psych negative neurological ROS  negative psych ROS   GI/Hepatic negative GI ROS, Neg liver ROS,   Endo/Other  negative endocrine ROS  Renal/GU negative Renal ROS     Musculoskeletal  (+) Arthritis ,   Abdominal Normal abdominal exam  (+)   Peds  Hematology negative hematology ROS (+)   Anesthesia Other Findings   Reproductive/Obstetrics                             Anesthesia Physical Anesthesia Plan  ASA: 2  Anesthesia Plan: General   Post-op Pain Management:    Induction: Intravenous  PONV Risk Score and Plan: 4 or greater and Ondansetron, Dexamethasone, Midazolam and Scopolamine patch - Pre-op  Airway Management Planned: LMA  Additional Equipment: None  Intra-op Plan:   Post-operative Plan: Extubation in OR  Informed Consent: I have reviewed the patients History and Physical, chart, labs and discussed the procedure including the risks, benefits and alternatives for the proposed anesthesia with the patient or authorized representative who has indicated his/her understanding and acceptance.     Dental advisory given  Plan Discussed with: CRNA  Anesthesia Plan Comments: (PAT note written 10/06/2021 by Myra Gianotti, PA-C. )       Anesthesia Quick Evaluation

## 2021-10-06 NOTE — Progress Notes (Signed)
Anesthesia Chart Review: ? Case: [id number] Date/Time: 10/12/21 1345  ? Procedure: RADIOACTIVE SEED GUIDED EXCISIONAL RIGHT BREAST BIOPSY (Right: Breast)  ? Anesthesia type: General  ? Pre-op diagnosis: ABNORMAL RIGHT MAMMOGRAM  ? Location: [facility] OR ROOM 02 / [place] [facility]  ? Surgeons: Stark Klein, MD  ? ?  ? ? ?DISCUSSION: Patient is a 56 year old female scheduled for the above procedure. ? ?History includes arthritis and abnormal right mammogram with previous right breast needle biopsy on 01/13/21 showing complex sclerosing lesion.  ? ?HR at PAT arrival was 114 bpm, but down to 96 bpm prior to leaving PAT. RN spoke with another Anesthesia APP provider.  Patient denies shortness of breath or chest pain.BP 123/88. ? ?Menstrual status is documented as: "Postmenopausal". RSL is scheduled for 10/11/21.  Anesthesia team to evaluate on the day of surgery. ? ? ?VS: BP 123/88   Pulse 96   Temp 36.8 ?C (Oral)   Resp 18   Ht '5\' 2"'$  (1.575 m)   Wt 67.2 kg   SpO2 98%   BMI 27.11 kg/m?  ? ?PROVIDERS: ?[facility] ?[facility] ? ?LABS: Labs reviewed: Acceptable for surgery. ?(all labs ordered are listed, but only abnormal results are displayed) ? ?Labs Reviewed  ?CBC  ? ? ?IMAGES: ?CXR 11/01/20 (in setting of + COVID-19): ?FINDINGS: ?The heart size and mediastinal contours are within normal limits. ?Both lungs are clear. The visualized skeletal structures are ?unremarkable. ?IMPRESSION: ?No active cardiopulmonary disease. ?  ? ?EKG: 11/01/20 (in setting of + COVID-19):  ?Sinus tachycardia at 117 bpm ?Low voltage QRS ?Septal infarct , age undetermined ? ? ?CV: N/A ? ?Past Medical History:  ?Diagnosis Date  ? 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 ? ?Past Surgical History:  ?Procedure Laterality Date  ? BREAST BIOPSY Right   ? 2015  ? ? ?MEDICATIONS: ? Biotin w/ Vitamins C & E (HAIR/SKIN/NAILS PO)  ? Coenzyme Q10 (CO Q-10) 200 MG CAPS  ? cyclobenzaprine (FLEXERIL) 10 MG tablet  ? meloxicam (MOBIC) 15  MG tablet  ? Multiple Vitamin (MULTIVITAMIN WITH MINERALS) TABS tablet  ? Omega-3 Fatty Acids (FISH OIL PO)  ? ?No current facility-administered medications for this encounter.  ? ? ?Myra Gianotti, PA-C ?Surgical Short Stay/Anesthesiology ?[facility] Phone [phone] ?[facility] Phone [phone] ?10/06/2021 7:23 PM ? ? ? ? ? ? ? ?

## 2021-10-09 NOTE — H&P (Signed)
?PROVIDER:  Georgianne Fick, MD ?  ?MRN: [id number] ?DOB: [birth year] ?DATE OF ENCOUNTER: 09/29/2021 ?Subjective  ?   ?Chief Complaint: Follow-up (Long term follow up r breast bx) ?  ?  ?  ?History of Present Illness: ?Marisa Bates is a 56 y.o. female who is seen today for follow up. ?  ?    ?Pt is a 56 yo F who had a screening detected region of right sided breast distortion 12/2020. She underwent diagnostic imaging that showed persistent distortion. There was no u/s correlate.  Core needle biopsy was performed.  This showed a complex sclerosing lesion.   ?  ?I first discussed this with her September 2022, but she did not pursue excision then.  She is back to discuss.  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  She has had to get x-rays and several hydrocortisone injections.  She denies any breast symptoms.  She does not know any significant details, but she did say her sister had to get a breast biopsy as well recently but did not require surgery and did not have cancer. ?  ?Review of Systems: ?A complete review of systems was obtained from the patient.  I have reviewed this information and discussed as appropriate with the patient.  See HPI as well for other ROS. ?  ?Review of Systems  ?All other systems reviewed and are negative. ?  ?  ?  ?Medical History: ?Past Medical History  ?History reviewed. No pertinent past medical history.  ? ?  ?   ?Patient Active Problem List  ?Diagnosis  ? Abnormal mammogram of right breast  ?  ?  ?Past Surgical History  ?History reviewed. No pertinent surgical history.  ?  ?  ?Allergies  ?No Known Allergies  ? ?  ?      ?Current Outpatient Medications on File Prior to Visit  ?Medication Sig Dispense Refill  ? cyclobenzaprine (FLEXERIL) 10 MG tablet        ? meloxicam (MOBIC) 7.5 MG tablet        ? rosuvastatin (CRESTOR) 5 MG tablet rosuvastatin 5 mg tablet      ? traZODone (DESYREL) 50 MG tablet daily      ?  ?No current facility-administered medications  on file prior to visit.  ?  ?  ?Family History  ?     ?Family History  ?Problem Relation Age of Onset  ? High blood pressure (Hypertension) Mother    ?  ?  ?  ?Social History  ?  ?   ?Tobacco Use  ?Smoking Status Never  ?Smokeless Tobacco Never  ?  ?  ?Social History  ?Social History  ?  ?    ?Socioeconomic History  ? Marital status: Married  ?Tobacco Use  ? Smoking status: Never  ? Smokeless tobacco: Never  ?Vaping Use  ? Vaping Use: Never used  ?Substance and Sexual Activity  ? Alcohol use: Never  ? Drug use: Never  ?  ?  ?  ?Objective:  ?  ?  ?   ?Vitals:  ?  09/29/21 1034  ?Pulse: 100  ?Temp: 36.7 ?C (98 ?F)  ?SpO2: 97%  ?Weight: 66.7 kg (147 lb)  ?  ?Body mass index is 26.89 kg/m?. ?  ?Head:   Normocephalic and atraumatic.  ?Eyes:    Conjunctivae are normal. Pupils are equal, round, and reactive to light. No scleral icterus.  ?Neck:   Normal range of motion. Neck supple. No tracheal deviation  present. No thyromegaly present.  ?Resp:   No respiratory distress, normal effort. ?Breast: mild to moderate ptosis.  Breast tissue still pretty dense.  No palpable masses.  No skin dimpling.  No nipple discharge or nipple retraction.   ?Abd:      Abdomen is soft, non distended and non tender. No masses are palpable.  There is no rebound and no guarding.  ?Neurological: Alert and oriented to person, place, and time. Coordination normal.  ?Skin:    Skin is warm and dry. No rash noted. No diaphoretic. No erythema. No pallor.  ?Psychiatric: Normal mood and affect. Normal behavior. Judgment and thought content normal.  ?  ?  ?Labs, Imaging and Diagnostic Testing: ?  ?n/a ?  ?  ?Assessment and Plan:  ?   ?Diagnoses and all orders for this visit: ?  ?Abnormal mammogram of right breast ?  ? Recommend excision of this CSL.    Surgery is set up for 2 weeks.  I reviewed surgery as well as risks.  We made sure the patient had information about where to show up for preop, seed placement, and surgery. ?  ?Will plan seed loc excisional  biopsy.   ?  ?The surgical procedure was described to the patient.  I discussed the incision type and location and that we would need radiology involved on with a wire or seed marker and/or sentinel node.     ?  ?The risks and benefits of the procedure were described to the patient and she wishes to proceed.   ?  ?We discussed the risks bleeding, infection, damage to other structures, need for further procedures/surgeries.  We discussed the risk of seroma.  The patient was advised if the area in the breast in cancer, we may need to go back to surgery for additional tissue to obtain negative margins or for a lymph node biopsy. The patient was advised that these are the most common complications, but that others can occur as well.  They were advised against taking aspirin or other anti-inflammatory agents/blood thinners the week before surgery.   ?  ?

## 2021-10-11 ENCOUNTER — Ambulatory Visit
Admission: RE | Admit: 2021-10-11 | Discharge: 2021-10-11 | Disposition: A | Discharge status: Home / Self Care | Payer: BC Managed Care – PPO | Source: Ambulatory Visit | Attending: General Surgery | Admitting: General Surgery

## 2021-10-11 DIAGNOSIS — Z09 Encounter for follow-up examination after completed treatment for conditions other than malignant neoplasm: Secondary | ICD-10-CM | POA: Diagnosis not present

## 2021-10-11 DIAGNOSIS — R928 Other abnormal and inconclusive findings on diagnostic imaging of breast: Secondary | ICD-10-CM | POA: Diagnosis not present

## 2021-10-12 ENCOUNTER — Other Ambulatory Visit: Payer: Self-pay

## 2021-10-12 ENCOUNTER — Encounter (HOSPITAL_COMMUNITY)
Admission: RE | Disposition: A | Discharge status: Home / Self Care | Payer: Self-pay | Source: Home / Self Care | Attending: General Surgery

## 2021-10-12 ENCOUNTER — Encounter (HOSPITAL_COMMUNITY): Payer: Self-pay | Admitting: General Surgery

## 2021-10-12 ENCOUNTER — Ambulatory Visit (HOSPITAL_COMMUNITY): Payer: BC Managed Care – PPO | Admitting: Emergency Medicine

## 2021-10-12 ENCOUNTER — Ambulatory Visit (HOSPITAL_COMMUNITY)
Admission: RE | Admit: 2021-10-12 | Discharge: 2021-10-12 | Disposition: A | Discharge status: Home / Self Care | Payer: BC Managed Care – PPO | Attending: General Surgery | Admitting: General Surgery

## 2021-10-12 ENCOUNTER — Ambulatory Visit
Admission: RE | Admit: 2021-10-12 | Discharge: 2021-10-12 | Disposition: A | Discharge status: Home / Self Care | Payer: BC Managed Care – PPO | Source: Ambulatory Visit | Attending: General Surgery | Admitting: General Surgery

## 2021-10-12 ENCOUNTER — Ambulatory Visit (HOSPITAL_COMMUNITY): Payer: BC Managed Care – PPO | Admitting: Anesthesiology

## 2021-10-12 DIAGNOSIS — N6489 Other specified disorders of breast: Secondary | ICD-10-CM | POA: Diagnosis not present

## 2021-10-12 DIAGNOSIS — R928 Other abnormal and inconclusive findings on diagnostic imaging of breast: Secondary | ICD-10-CM | POA: Diagnosis not present

## 2021-10-12 DIAGNOSIS — M1990 Unspecified osteoarthritis, unspecified site: Secondary | ICD-10-CM | POA: Insufficient documentation

## 2021-10-12 DIAGNOSIS — M1712 Unilateral primary osteoarthritis, left knee: Secondary | ICD-10-CM | POA: Insufficient documentation

## 2021-10-12 DIAGNOSIS — L905 Scar conditions and fibrosis of skin: Secondary | ICD-10-CM | POA: Diagnosis not present

## 2021-10-12 HISTORY — PX: RADIOACTIVE SEED GUIDED EXCISIONAL BREAST BIOPSY: SHX6490

## 2021-10-12 SURGERY — RADIOACTIVE SEED GUIDED BREAST BIOPSY
Anesthesia: General | Site: Breast | Laterality: Right

## 2021-10-12 MED ORDER — DEXAMETHASONE SODIUM PHOSPHATE 10 MG/ML IJ SOLN
INTRAMUSCULAR | Status: AC
  Filled 2021-10-12: qty 1

## 2021-10-12 MED ORDER — FENTANYL CITRATE (PF) 250 MCG/5ML IJ SOLN
INTRAMUSCULAR | Status: DC | PRN
  Administered 2021-10-12 (×3): 25 ug via INTRAVENOUS

## 2021-10-12 MED ORDER — CHLORHEXIDINE GLUCONATE CLOTH 2 % EX PADS: 6.0000 | MEDICATED_PAD | Freq: Once | CUTANEOUS | Status: DC

## 2021-10-12 MED ORDER — PROPOFOL 10 MG/ML IV BOLUS
INTRAVENOUS | Status: DC | PRN
  Administered 2021-10-12: 200 mg via INTRAVENOUS

## 2021-10-12 MED ORDER — LIDOCAINE 2% (20 MG/ML) 5 ML SYRINGE
INTRAMUSCULAR | Status: DC | PRN
  Administered 2021-10-12: 40 mg via INTRAVENOUS

## 2021-10-12 MED ORDER — LACTATED RINGERS IV SOLN
INTRAVENOUS | Status: DC
Start: 2021-10-12 — End: 2021-10-12

## 2021-10-12 MED ORDER — LIDOCAINE 2% (20 MG/ML) 5 ML SYRINGE
INTRAMUSCULAR | Status: AC
  Filled 2021-10-12: qty 5

## 2021-10-12 MED ORDER — ONDANSETRON HCL 4 MG/2ML IJ SOLN
INTRAMUSCULAR | Status: AC
  Filled 2021-10-12: qty 2

## 2021-10-12 MED ORDER — CEFAZOLIN SODIUM-DEXTROSE 2-4 GM/100ML-% IV SOLN
2.0000 g | INTRAVENOUS | Status: AC
Start: 2021-10-12 — End: 2021-10-12
  Administered 2021-10-12: 2 g via INTRAVENOUS
  Filled 2021-10-12: qty 100

## 2021-10-12 MED ORDER — 0.9 % SODIUM CHLORIDE (POUR BTL) OPTIME
TOPICAL | Status: DC | PRN
  Administered 2021-10-12: 1000 mL

## 2021-10-12 MED ORDER — MIDAZOLAM HCL 2 MG/2ML IJ SOLN
INTRAMUSCULAR | Status: DC | PRN
  Administered 2021-10-12: 2 mg via INTRAVENOUS

## 2021-10-12 MED ORDER — ORAL CARE MOUTH RINSE: 15.0000 mL | Freq: Once | OROMUCOSAL | Status: AC

## 2021-10-12 MED ORDER — PROPOFOL 10 MG/ML IV BOLUS
INTRAVENOUS | Status: AC
  Filled 2021-10-12: qty 20

## 2021-10-12 MED ORDER — BUPIVACAINE-EPINEPHRINE (PF) 0.25% -1:200000 IJ SOLN
INTRAMUSCULAR | Status: AC
  Filled 2021-10-12: qty 30

## 2021-10-12 MED ORDER — CHLORHEXIDINE GLUCONATE 0.12 % MT SOLN
15.0000 mL | Freq: Once | OROMUCOSAL | Status: AC
  Administered 2021-10-12: 15 mL via OROMUCOSAL
  Filled 2021-10-12: qty 15

## 2021-10-12 MED ORDER — FENTANYL CITRATE (PF) 250 MCG/5ML IJ SOLN
INTRAMUSCULAR | Status: AC
  Filled 2021-10-12: qty 5

## 2021-10-12 MED ORDER — ONDANSETRON HCL 4 MG/2ML IJ SOLN
INTRAMUSCULAR | Status: DC | PRN
Start: 2021-10-12 — End: 2021-10-12
  Administered 2021-10-12: 4 mg via INTRAVENOUS

## 2021-10-12 MED ORDER — LIDOCAINE HCL 1 % IJ SOLN
INTRAMUSCULAR | Status: DC | PRN
  Administered 2021-10-12: 40 mL

## 2021-10-12 MED ORDER — LIDOCAINE HCL (PF) 1 % IJ SOLN
INTRAMUSCULAR | Status: AC
  Filled 2021-10-12: qty 30

## 2021-10-12 MED ORDER — ACETAMINOPHEN 500 MG PO TABS
1000.0000 mg | ORAL_TABLET | ORAL | Status: AC
  Administered 2021-10-12: 1000 mg via ORAL
  Filled 2021-10-12: qty 2

## 2021-10-12 MED ORDER — OXYCODONE HCL 5 MG PO TABS: 5.0000 mg | ORAL_TABLET | Freq: Four times a day (QID) | ORAL | 0 refills | Status: AC | PRN

## 2021-10-12 MED ORDER — DEXAMETHASONE SODIUM PHOSPHATE 10 MG/ML IJ SOLN
INTRAMUSCULAR | Status: DC | PRN
  Administered 2021-10-12: 10 mg via INTRAVENOUS

## 2021-10-12 MED ORDER — MIDAZOLAM HCL 2 MG/2ML IJ SOLN
INTRAMUSCULAR | Status: AC
  Filled 2021-10-12: qty 2

## 2021-10-12 SURGICAL SUPPLY — 50 items
ADH SKN CLS APL DERMABOND .7 (GAUZE/BANDAGES/DRESSINGS) ×1
APL PRP STRL LF DISP 70% ISPRP (MISCELLANEOUS) ×1
BAG COUNTER SPONGE SURGICOUNT (BAG) ×2 IMPLANT
BAG SPNG CNTER NS LX DISP (BAG) ×1
BINDER BREAST LRG (GAUZE/BANDAGES/DRESSINGS) IMPLANT
BINDER BREAST XLRG (GAUZE/BANDAGES/DRESSINGS) ×1 IMPLANT
BNDG COHESIVE 4X5 TAN STRL (GAUZE/BANDAGES/DRESSINGS) ×2 IMPLANT
CANISTER SUCT 3000ML PPV (MISCELLANEOUS) ×2 IMPLANT
CHLORAPREP W/TINT 26 (MISCELLANEOUS) ×2 IMPLANT
CLIP TI LARGE 6 (CLIP) ×2 IMPLANT
CLIP TI MEDIUM 24 (CLIP) ×2 IMPLANT
CNTNR URN SCR LID CUP LEK RST (MISCELLANEOUS) IMPLANT
CONT SPEC 4OZ STRL OR WHT (MISCELLANEOUS)
COVER PROBE W GEL 5X96 (DRAPES) ×2 IMPLANT
COVER SURGICAL LIGHT HANDLE (MISCELLANEOUS) ×2 IMPLANT
DERMABOND ADVANCED (GAUZE/BANDAGES/DRESSINGS) ×1
DERMABOND ADVANCED .7 DNX12 (GAUZE/BANDAGES/DRESSINGS) ×1 IMPLANT
DEVICE DUBIN SPECIMEN MAMMOGRA (MISCELLANEOUS) IMPLANT
DRAPE CHEST BREAST 15X10 FENES (DRAPES) ×2 IMPLANT
DRAPE SURG 17X23 STRL (DRAPES) IMPLANT
DRSG PAD ABDOMINAL 8X10 ST (GAUZE/BANDAGES/DRESSINGS) ×1 IMPLANT
ELECT COATED BLADE 2.86 ST (ELECTRODE) ×2 IMPLANT
ELECT REM PT RETURN 9FT ADLT (ELECTROSURGICAL) ×2
ELECTRODE REM PT RTRN 9FT ADLT (ELECTROSURGICAL) ×1 IMPLANT
GAUZE SPONGE 4X4 12PLY STRL (GAUZE/BANDAGES/DRESSINGS) ×1 IMPLANT
GLOVE BIO SURGEON STRL SZ 6 (GLOVE) ×2 IMPLANT
GLOVE INDICATOR 6.5 STRL GRN (GLOVE) ×2 IMPLANT
GOWN STRL REUS W/ TWL LRG LVL3 (GOWN DISPOSABLE) ×1 IMPLANT
GOWN STRL REUS W/TWL 2XL LVL3 (GOWN DISPOSABLE) ×2 IMPLANT
GOWN STRL REUS W/TWL LRG LVL3 (GOWN DISPOSABLE) ×2
KIT BASIN OR (CUSTOM PROCEDURE TRAY) ×2 IMPLANT
KIT MARKER MARGIN INK (KITS) ×2 IMPLANT
LIGHT WAVEGUIDE WIDE FLAT (MISCELLANEOUS) IMPLANT
NDL 18GX1X1/2 (RX/OR ONLY) (NEEDLE) IMPLANT
NDL FILTER BLUNT 18X1 1/2 (NEEDLE) IMPLANT
NDL HYPO 25GX1X1/2 BEV (NEEDLE) ×1 IMPLANT
NEEDLE 18GX1X1/2 (RX/OR ONLY) (NEEDLE) IMPLANT
NEEDLE FILTER BLUNT 18X 1/2SAF (NEEDLE)
NEEDLE FILTER BLUNT 18X1 1/2 (NEEDLE) IMPLANT
NEEDLE HYPO 25GX1X1/2 BEV (NEEDLE) ×2 IMPLANT
NS IRRIG 1000ML POUR BTL (IV SOLUTION) ×2 IMPLANT
PACK GENERAL/GYN (CUSTOM PROCEDURE TRAY) ×2 IMPLANT
PACK UNIVERSAL I (CUSTOM PROCEDURE TRAY) ×2 IMPLANT
STOCKINETTE IMPERVIOUS 9X36 MD (GAUZE/BANDAGES/DRESSINGS) ×2 IMPLANT
STRIP CLOSURE SKIN 1/2X4 (GAUZE/BANDAGES/DRESSINGS) ×1 IMPLANT
SUT MNCRL AB 4-0 PS2 18 (SUTURE) ×2 IMPLANT
SUT VIC AB 3-0 SH 8-18 (SUTURE) ×2 IMPLANT
SYR CONTROL 10ML LL (SYRINGE) ×2 IMPLANT
TOWEL GREEN STERILE (TOWEL DISPOSABLE) ×2 IMPLANT
TOWEL GREEN STERILE FF (TOWEL DISPOSABLE) ×2 IMPLANT

## 2021-10-12 NOTE — Discharge Instructions (Addendum)
[facility] Office Phone Number [phone]  BREAST BIOPSY/ PARTIAL MASTECTOMY: POST OP INSTRUCTIONS  Always review your discharge instruction sheet given to you by the facility where your surgery was performed.  IF YOU HAVE DISABILITY OR FAMILY LEAVE FORMS, YOU MUST BRING THEM TO THE OFFICE FOR PROCESSING.  DO NOT GIVE THEM TO YOUR DOCTOR.  Take 2 tylenol (acetominophen) three times a day for 3 days.  If you still have pain, add ibuprofen with food in between if able to take this (if you have kidney issues or stomach issues, do not take ibuprofen).  If both of those are not enough, add the narcotic pain pill.  If you find you are needing a lot of this overnight after surgery, call the next morning for a refill.    Prescriptions will not be filled after 5pm or on week-ends. Take your usually prescribed medications unless otherwise directed You should eat very light the first 24 hours after surgery, such as soup, crackers, pudding, etc.  Resume your normal diet the day after surgery. Most patients will experience some swelling and bruising in the breast.  Ice packs and a good support bra will help.  Swelling and bruising can take several days to resolve.  It is common to experience some constipation if taking pain medication after surgery.  Increasing fluid intake and taking a stool softener will usually help or prevent this problem from occurring.  A mild laxative (Milk of Magnesia or Miralax) should be taken according to package directions if there are no bowel movements after 48 hours. Unless discharge instructions indicate otherwise, you may remove your bandages 48 hours after surgery, and you may shower at that time.  You may have steri-strips (small skin tapes) in place directly over the incision.  These strips should be left on the skin at least for for 7-10 days.    ACTIVITIES:  You may resume regular daily activities (gradually increasing) beginning the next day.  Wearing a  good support bra or sports bra (or the breast binder) minimizes pain and swelling.  You may have sexual intercourse when it is comfortable. No heavy lifting for 1-2 weeks (not over around 10 pounds).  You may drive when you no longer are taking prescription pain medication, you can comfortably wear a seatbelt, and you can safely maneuver your car and apply brakes. RETURN TO WORK:  __________3-14 days depending on job. _______________ You should see your doctor in the office for a follow-up appointment approximately two weeks after your surgery.  Your doctor's nurse will typically make your follow-up appointment when she calls you with your pathology report.  Expect your pathology report 3-4 business days after your surgery.  You may call to check if you do not hear from us after three days.   WHEN TO CALL YOUR DOCTOR: Fever over 101.0 Nausea and/or vomiting. Extreme swelling or bruising. Continued bleeding from incision. Increased pain, redness, or drainage from the incision.  The clinic staff is available to answer your questions during regular business hours.  Please don't hesitate to call and ask to speak to one of the nurses for clinical concerns.  If you have a medical emergency, go to the nearest emergency room or call 911.  A surgeon from [facility] is always on call at the hospital.  For further questions, please visit [email or web address]   

## 2021-10-12 NOTE — Interval H&P Note (Signed)
History and Physical Interval Note:  10/12/2021 12:34 PM  Marisa Bates  has presented today for surgery, with the diagnosis of ABNORMAL RIGHT MAMMOGRAM.  The various methods of treatment have been discussed with the patient and family. After consideration of risks, benefits and other options for treatment, the patient has consented to  Procedure(s): RADIOACTIVE SEED GUIDED EXCISIONAL RIGHT BREAST BIOPSY (Right) as a surgical intervention.  The patient's history has been reviewed, patient examined, no change in status, stable for surgery.  I have reviewed the patient's chart and labs.  Questions were answered to the patient's satisfaction.     Stark Klein

## 2021-10-12 NOTE — Transfer of Care (Signed)
Immediate Anesthesia Transfer of Care Note  Patient: Marisa Bates  Procedure(s) Performed: RADIOACTIVE SEED GUIDED EXCISIONAL RIGHT BREAST BIOPSY (Right: Breast)  Patient Location: PACU  Anesthesia Type:General  Level of Consciousness: drowsy  Airway & Oxygen Therapy: Patient Spontanous Breathing  Post-op Assessment: Report given to RN and Post -op Vital signs reviewed and stable  Post vital signs: Reviewed and stable  Last Vitals:  Vitals Value Taken Time  BP 114/100 10/12/21 1439  Temp    Pulse 89 10/12/21 1439  Resp 17 10/12/21 1439  SpO2 100 % 10/12/21 1439  Vitals shown include unvalidated device data.  Last Pain:  Vitals:   10/12/21 1203  TempSrc:   PainSc: 5       Patients Stated Pain Goal: 0 (20/72/18 2883)  Complications: No notable events documented.

## 2021-10-12 NOTE — Anesthesia Postprocedure Evaluation (Signed)
Anesthesia Post Note  Patient: Marisa Bates  Procedure(s) Performed: RADIOACTIVE SEED GUIDED EXCISIONAL RIGHT BREAST BIOPSY (Right: Breast)     Patient location during evaluation: PACU Anesthesia Type: General Level of consciousness: awake and alert Pain management: pain level controlled Vital Signs Assessment: post-procedure vital signs reviewed and stable Respiratory status: spontaneous breathing, nonlabored ventilation, respiratory function stable and patient connected to nasal cannula oxygen Cardiovascular status: blood pressure returned to baseline and stable Postop Assessment: no apparent nausea or vomiting Anesthetic complications: no   No notable events documented.  Last Vitals:  Vitals:   10/12/21 1524 10/12/21 1539  BP: 124/77 128/79  Pulse: 71 70  Resp: 18 20  Temp:  (!) 36.2 C  SpO2: 99% 99%    Last Pain:  Vitals:   10/12/21 1539  TempSrc:   PainSc: 0-No pain                 Effie Berkshire

## 2021-10-12 NOTE — Op Note (Signed)
Right Breast Radioactive seed localized excisional biopsy  Indications: This patient presents with history of abnormal right mammogram with discordant core needle biopsy, complex sclerosing lesion on core needle biopsy  Pre-operative Diagnosis: abnormal right mammogram    Post-operative Diagnosis: abnormal right mammogram  Surgeon: Stark Klein   Anesthesia: General endotracheal anesthesia  ASA Class: 2  Procedure Details  The patient was seen in the Holding Room. The risks, benefits, complications, treatment options, and expected outcomes were discussed with the patient. The possibilities of bleeding, infection, the need for additional procedures, failure to diagnose a condition, and creating a complication requiring transfusion or operation were discussed with the patient. The patient concurred with the proposed plan, giving informed consent.  The site of surgery properly noted/marked. The patient was taken to Operating Room # 2, identified, and the procedure verified as Right Breast seed localized excisional biopsy. A Time Out was held and the above information confirmed.  The right breast and chest were prepped and draped in standard fashion. A medial circumlinear incision was made near the previously placed radioactive seed.  Dissection was carried down around the point of maximum signal intensity. The cautery was used to perform the dissection.   The specimen was inked with the margin marker paint kit.    Specimen radiography confirmed inclusion of the mammographic lesion, the clip, and the seed.  A small defect was made in the implant capsule. This was closed with a 2-0 vicryl.  The background signal in the breast was zero.  One clip was placed in the breast cavity.  Hemostasis was achieved with cautery.  The wound was irrigated and closed with 3-0 vicryl interrupted deep dermal sutures and 4-0 monocryl running subcuticular suture.      Sterile dressings were applied. At the end of the  operation, all sponge, instrument, and needle counts were correct.  Findings: Seed, clip in specimen.  posterior margin is implant capsule  Estimated Blood Loss:  min         Specimens: right breast tissue with seed         Complications:  None; patient tolerated the procedure well.         Disposition: PACU - hemodynamically stable.         Condition: stable

## 2021-10-12 NOTE — Anesthesia Procedure Notes (Signed)
Procedure Name: LMA Insertion Date/Time: 10/12/2021 1:39 PM Performed by: Erick Colace, CRNA Pre-anesthesia Checklist: Patient identified, Emergency Drugs available, Suction available and Patient being monitored Patient Re-evaluated:Patient Re-evaluated prior to induction Oxygen Delivery Method: Circle system utilized Preoxygenation: Pre-oxygenation with 100% oxygen Induction Type: IV induction LMA: LMA inserted LMA Size: 4.0 Placement Confirmation: positive ETCO2 Dental Injury: Teeth and Oropharynx as per pre-operative assessment

## 2021-10-13 ENCOUNTER — Encounter (HOSPITAL_COMMUNITY): Payer: Self-pay | Admitting: General Surgery

## 2021-10-16 LAB — SURGICAL PATHOLOGY

## 2021-11-02 ENCOUNTER — Other Ambulatory Visit: Payer: Self-pay | Admitting: General Surgery

## 2021-11-02 ENCOUNTER — Encounter (HOSPITAL_COMMUNITY): Payer: Self-pay

## 2021-11-02 DIAGNOSIS — Z1231 Encounter for screening mammogram for malignant neoplasm of breast: Secondary | ICD-10-CM

## 2021-11-23 DIAGNOSIS — M170 Bilateral primary osteoarthritis of knee: Secondary | ICD-10-CM | POA: Diagnosis not present

## 2021-12-11 DIAGNOSIS — M1712 Unilateral primary osteoarthritis, left knee: Secondary | ICD-10-CM | POA: Diagnosis not present

## 2021-12-14 DIAGNOSIS — M5416 Radiculopathy, lumbar region: Secondary | ICD-10-CM | POA: Diagnosis not present

## 2021-12-18 DIAGNOSIS — M1712 Unilateral primary osteoarthritis, left knee: Secondary | ICD-10-CM | POA: Diagnosis not present

## 2021-12-25 DIAGNOSIS — M1712 Unilateral primary osteoarthritis, left knee: Secondary | ICD-10-CM | POA: Diagnosis not present

## 2022-01-01 ENCOUNTER — Ambulatory Visit: Payer: BC Managed Care – PPO

## 2022-01-12 ENCOUNTER — Ambulatory Visit
Admission: RE | Admit: 2022-01-12 | Discharge: 2022-01-12 | Disposition: A | Discharge status: Home / Self Care | Payer: BC Managed Care – PPO | Source: Ambulatory Visit | Attending: General Surgery | Admitting: General Surgery

## 2022-01-12 DIAGNOSIS — Z1231 Encounter for screening mammogram for malignant neoplasm of breast: Secondary | ICD-10-CM | POA: Diagnosis not present

## 2022-02-16 DIAGNOSIS — M170 Bilateral primary osteoarthritis of knee: Secondary | ICD-10-CM | POA: Diagnosis not present

## 2022-02-19 DIAGNOSIS — M1712 Unilateral primary osteoarthritis, left knee: Secondary | ICD-10-CM | POA: Diagnosis not present

## 2022-02-19 DIAGNOSIS — M25562 Pain in left knee: Secondary | ICD-10-CM | POA: Diagnosis not present

## 2022-02-27 DIAGNOSIS — M25562 Pain in left knee: Secondary | ICD-10-CM | POA: Diagnosis not present

## 2022-02-28 DIAGNOSIS — M47816 Spondylosis without myelopathy or radiculopathy, lumbar region: Secondary | ICD-10-CM | POA: Diagnosis not present

## 2022-03-02 DIAGNOSIS — M1712 Unilateral primary osteoarthritis, left knee: Secondary | ICD-10-CM | POA: Diagnosis not present

## 2022-03-20 DIAGNOSIS — M47816 Spondylosis without myelopathy or radiculopathy, lumbar region: Secondary | ICD-10-CM | POA: Diagnosis not present

## 2022-03-22 DIAGNOSIS — M1712 Unilateral primary osteoarthritis, left knee: Secondary | ICD-10-CM | POA: Diagnosis not present

## 2022-03-22 DIAGNOSIS — M170 Bilateral primary osteoarthritis of knee: Secondary | ICD-10-CM | POA: Diagnosis not present

## 2022-03-22 DIAGNOSIS — M1711 Unilateral primary osteoarthritis, right knee: Secondary | ICD-10-CM | POA: Diagnosis not present

## 2022-03-23 DIAGNOSIS — Z01818 Encounter for other preprocedural examination: Secondary | ICD-10-CM | POA: Diagnosis not present

## 2022-03-23 DIAGNOSIS — M179 Osteoarthritis of knee, unspecified: Secondary | ICD-10-CM | POA: Diagnosis not present

## 2022-04-03 DIAGNOSIS — M25561 Pain in right knee: Secondary | ICD-10-CM | POA: Diagnosis not present

## 2022-04-10 DIAGNOSIS — M47816 Spondylosis without myelopathy or radiculopathy, lumbar region: Secondary | ICD-10-CM | POA: Diagnosis not present

## 2022-04-12 DIAGNOSIS — M170 Bilateral primary osteoarthritis of knee: Secondary | ICD-10-CM | POA: Diagnosis not present

## 2022-04-30 DIAGNOSIS — M25662 Stiffness of left knee, not elsewhere classified: Secondary | ICD-10-CM | POA: Diagnosis not present

## 2022-04-30 DIAGNOSIS — M1732 Unilateral post-traumatic osteoarthritis, left knee: Secondary | ICD-10-CM | POA: Diagnosis not present

## 2022-05-01 IMAGING — MG MM DIGITAL DIAGNOSTIC UNILAT*R* IMPLANT W/ TOMO W/ CAD
6 series · 6 of 18 positions shown · non-contrast
Comparison: Previous exam(s).

CLINICAL DATA: 54-year-old female for further evaluation of
possible RIGHT breast distortion on screening mammogram.

EXAM:
DIGITAL DIAGNOSTIC UNILATERAL RIGHT MAMMOGRAM WITH IMPLANTS, CAD AND
TOMOSYNTHESIS; ULTRASOUND RIGHT BREAST LIMITED
TECHNIQUE: Right digital diagnostic mammography and breast tomosynthesis was
performed. The images were evaluated with computer-aided detection.
Standard and/or implant displaced views were performed.; Targeted
ultrasound examination of the right breast was performed

[R ML synth-2D]
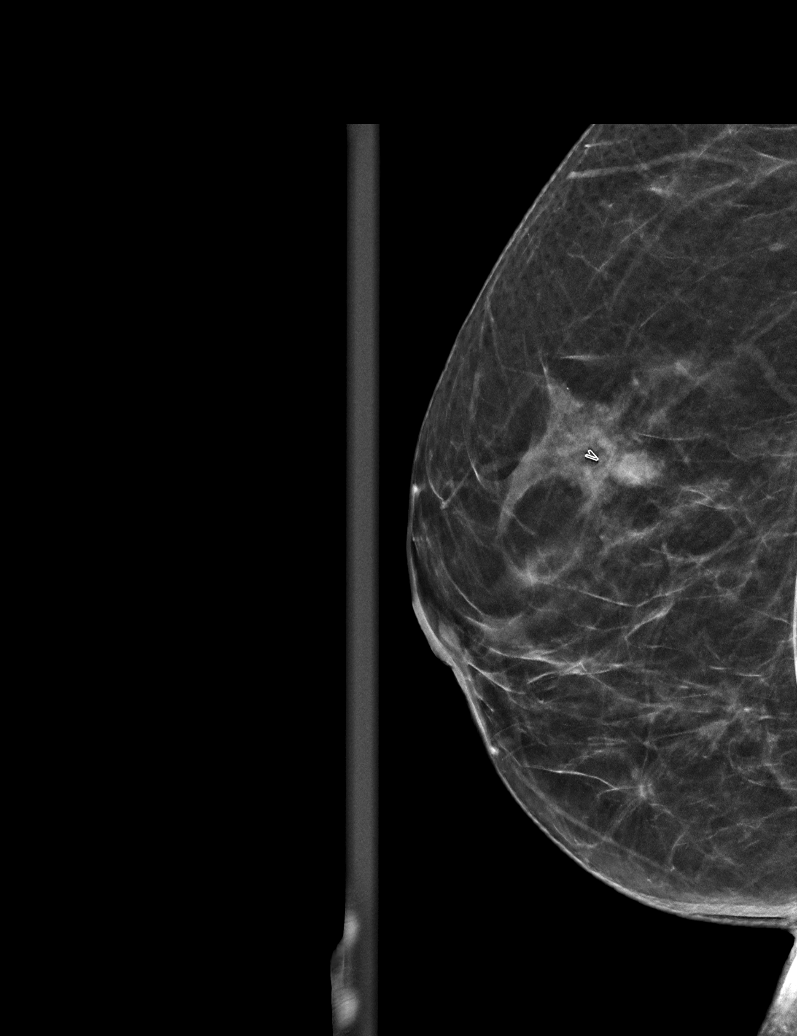

[R CC synth-2D]
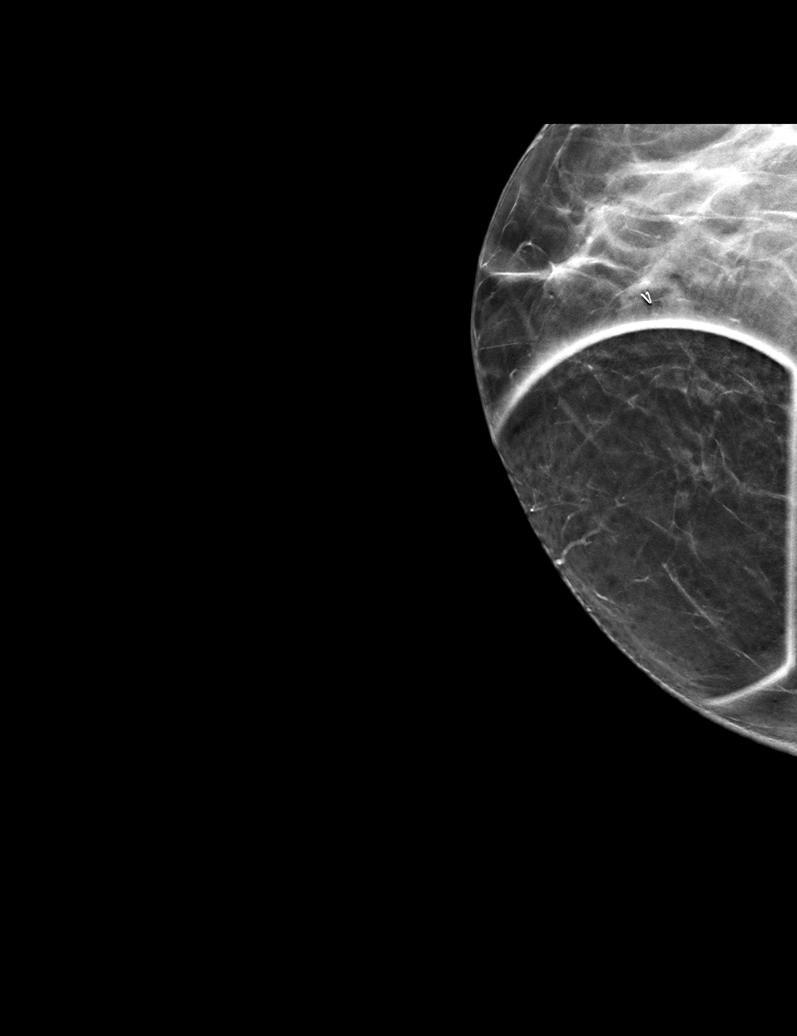

[R MLO synth-2D]
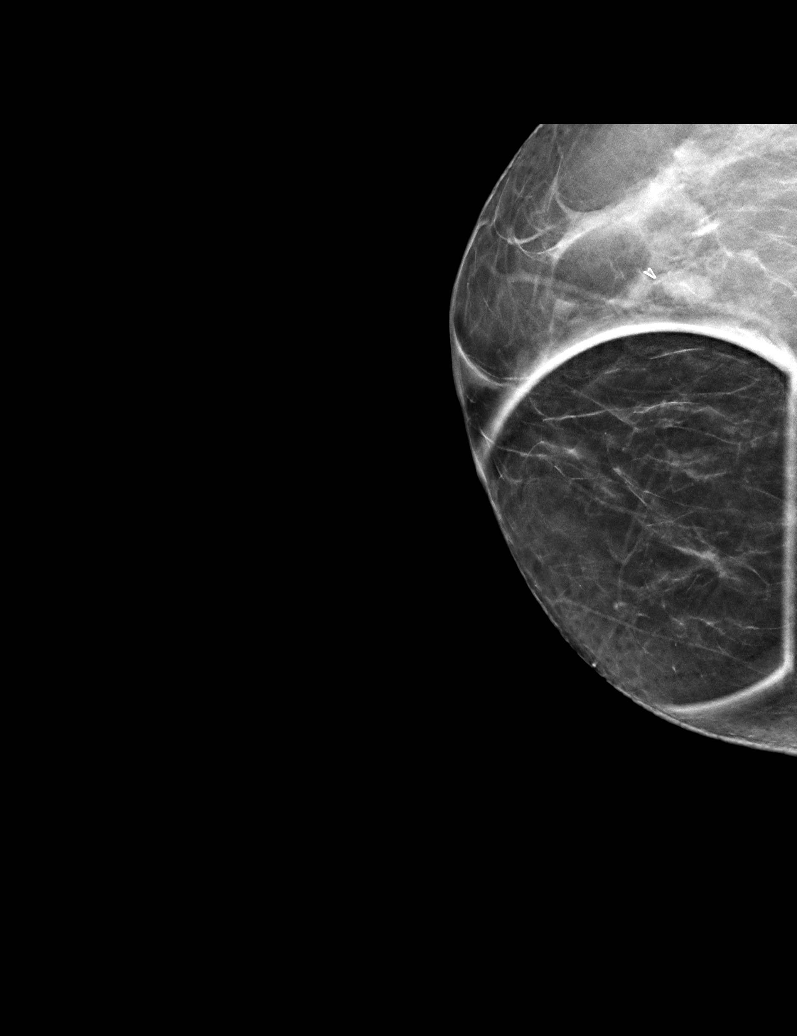

[R MLOID BREAST TOMOSYNTHESIS IMAGE tomo · tomo slice 24/47.0]
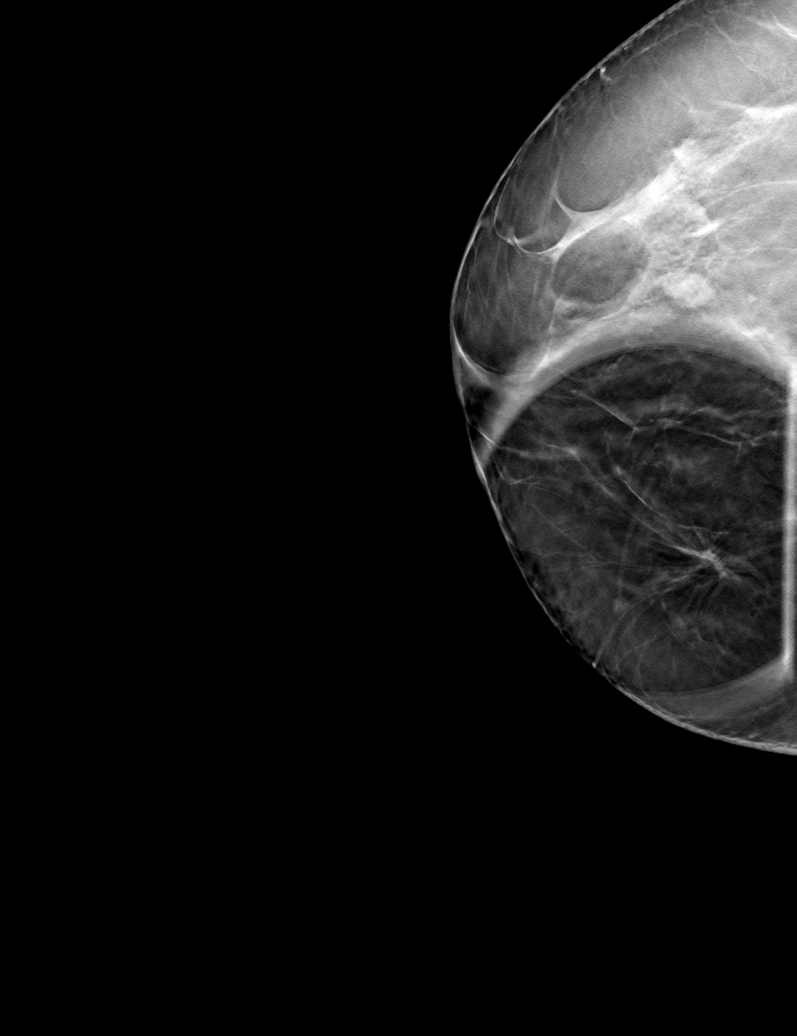

[R MLID BREAST TOMOSYNTHESIS IMAGE tomo · tomo slice 28/55.0]
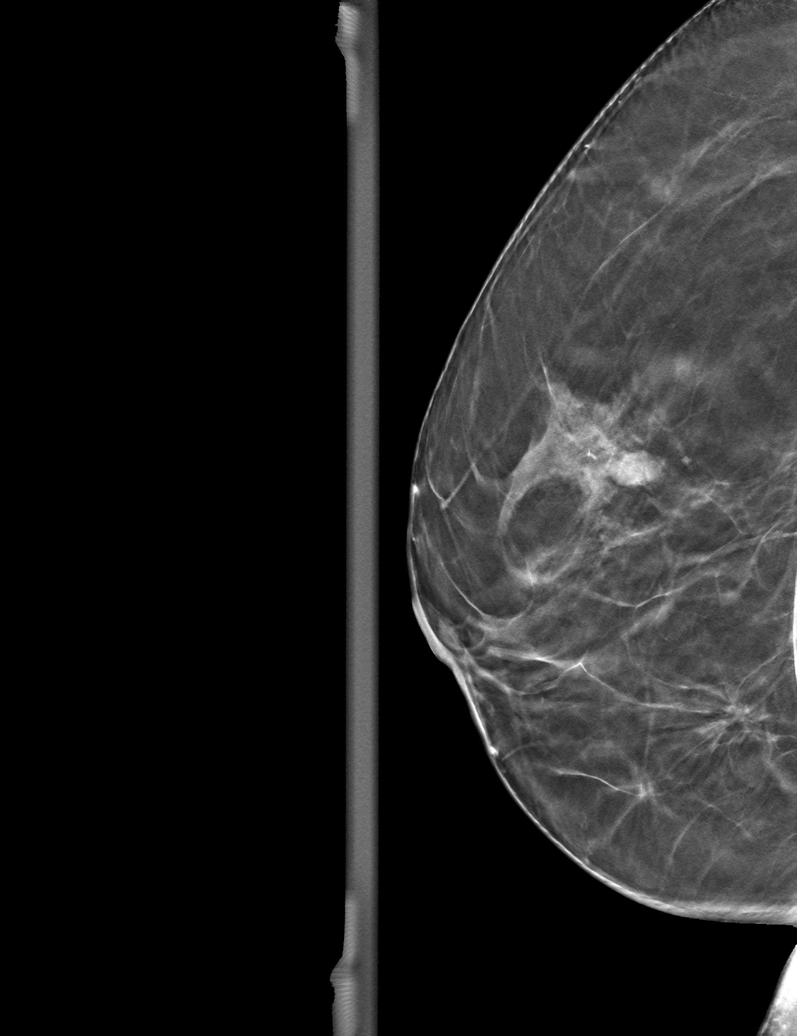

[R CCID BREAST TOMOSYNTHESIS IMAGE tomo · tomo slice 25/49.0]
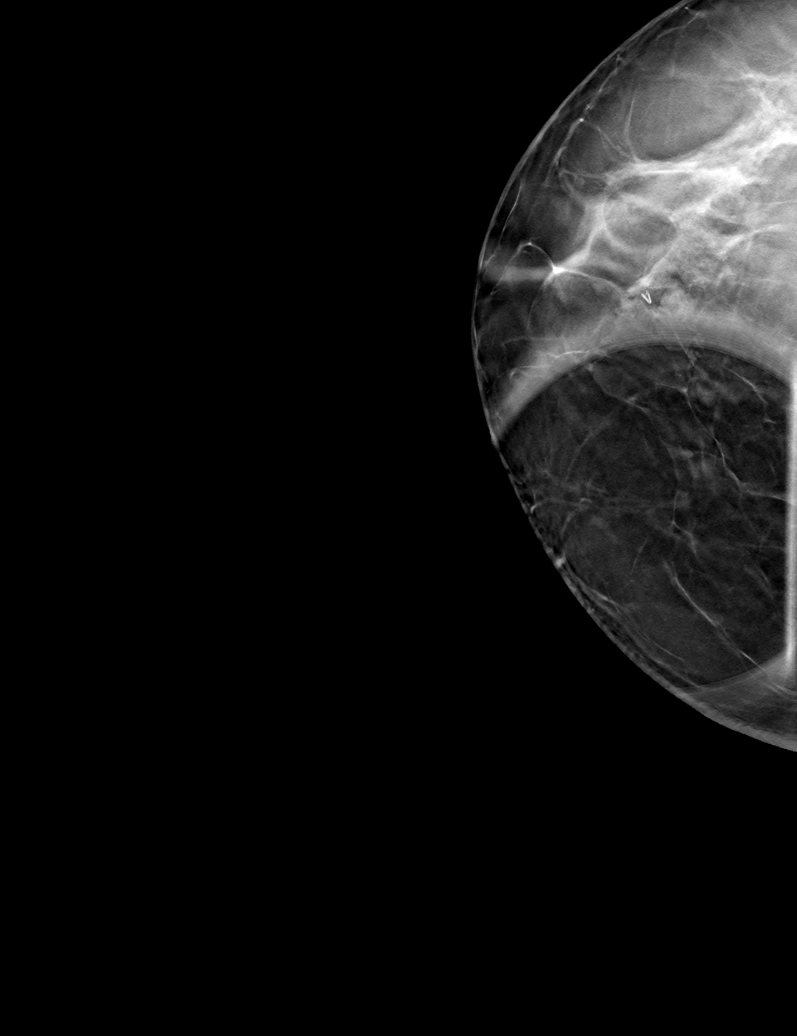

[6 of 18 positions shown; findings below may reference images not displayed]

ACR Breast Density Category b: There are scattered areas of
fibroglandular density.
FINDINGS: Full field and spot compression views of the RIGHT breast
demonstrate persistent distortion within the LOWER INNER RIGHT
breast, middle depth.

Targeted ultrasound is performed, showing no definite sonographic
correlate to the LOWER INNER RIGHT breast distortion identified
mammographically.

No abnormal RIGHT axillary lymph nodes are noted.
IMPRESSION: Persistent LOWER INNER RIGHT breast distortion. Tissue sampling is
recommended. No abnormal appearing RIGHT axillary lymph nodes.

RECOMMENDATION:
3D/stereotactic guided RIGHT breast biopsy, which will be scheduled.

I have discussed the findings and recommendations with the patient.
If applicable, a reminder letter will be sent to the patient
regarding the next appointment.

BI-RADS CATEGORY  4: Suspicious.

## 2022-05-01 IMAGING — US US BREAST*R* LIMITED INC AXILLA
1 series · 4 of 4 positions shown · non-contrast
Comparison: Previous exam(s).

CLINICAL DATA: 54-year-old female for further evaluation of
possible RIGHT breast distortion on screening mammogram.

EXAM:
DIGITAL DIAGNOSTIC UNILATERAL RIGHT MAMMOGRAM WITH IMPLANTS, CAD AND
TOMOSYNTHESIS; ULTRASOUND RIGHT BREAST LIMITED
TECHNIQUE: Right digital diagnostic mammography and breast tomosynthesis was
performed. The images were evaluated with computer-aided detection.
Standard and/or implant displaced views were performed.; Targeted
ultrasound examination of the right breast was performed

[Series 1: us breast*right* limited inc axilla · 0.07mm/px · 4 of 4 slices shown]
[im 1/4]
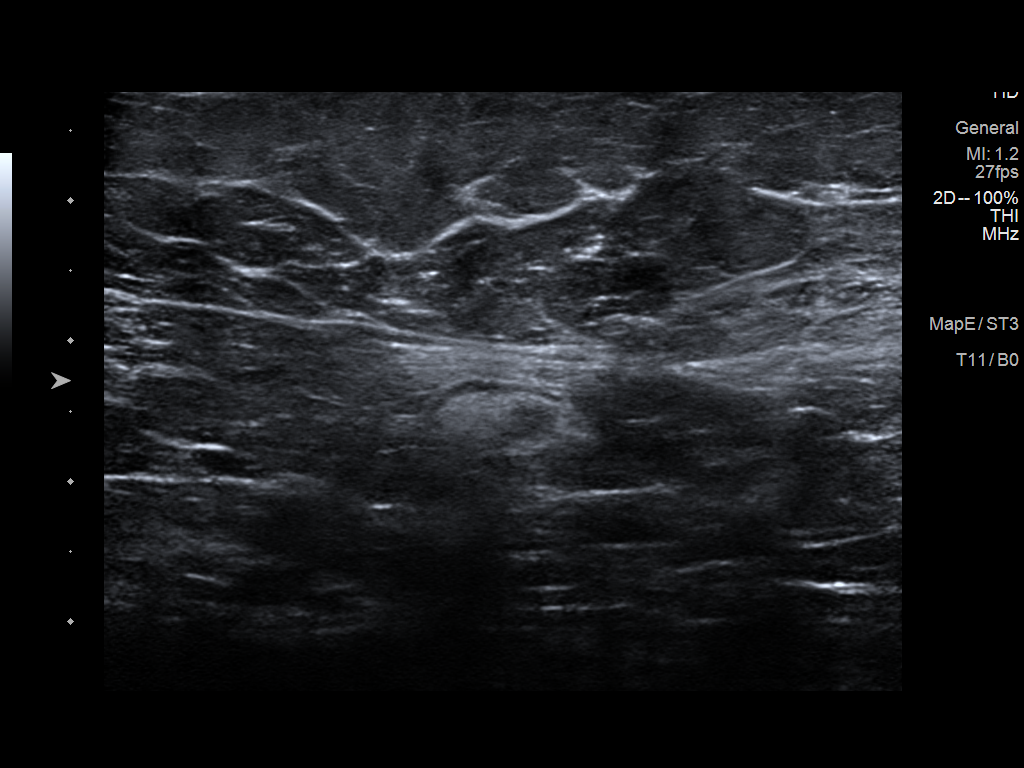
[im 2/4]
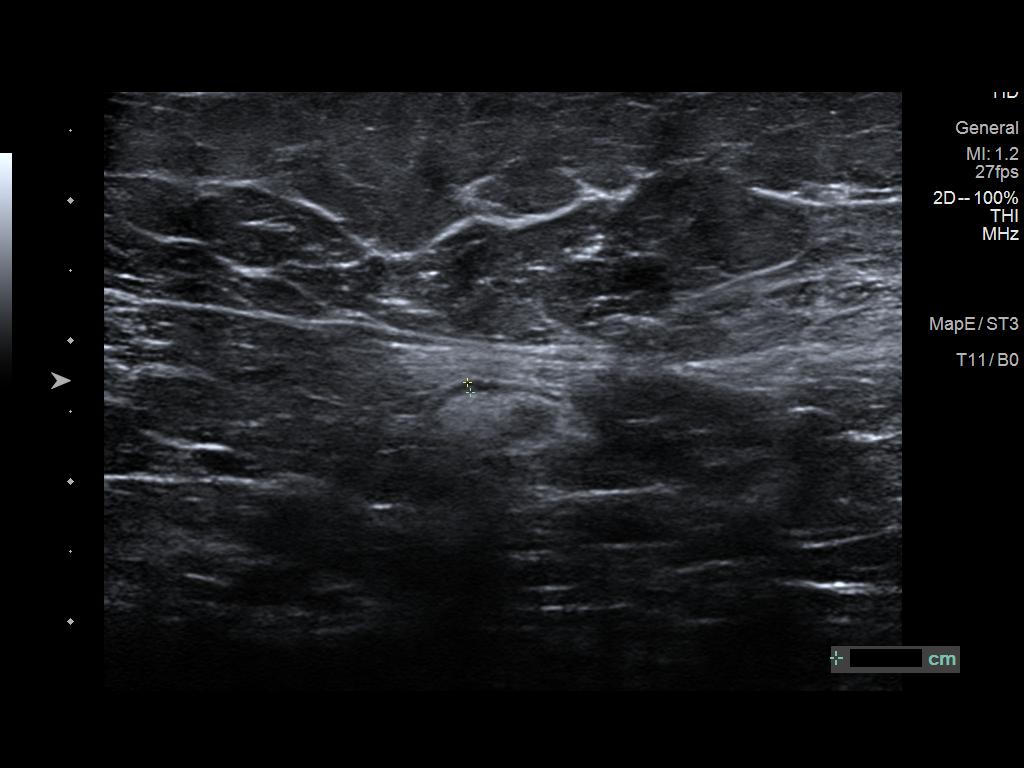
[im 3/4]
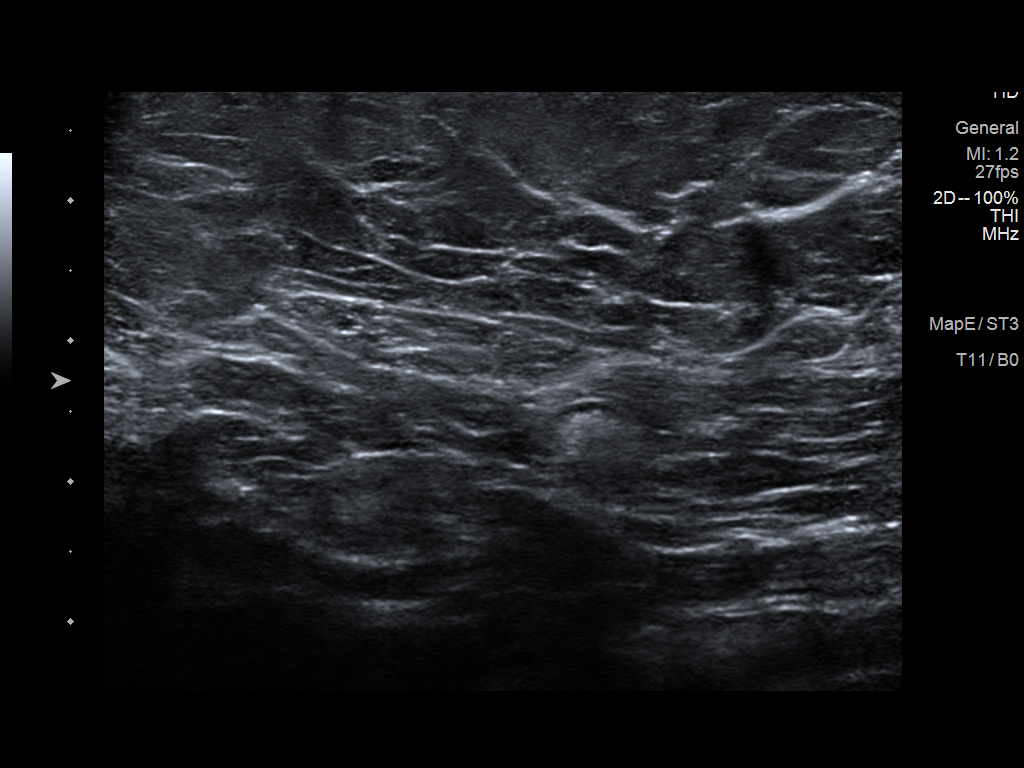
[im 4/4]
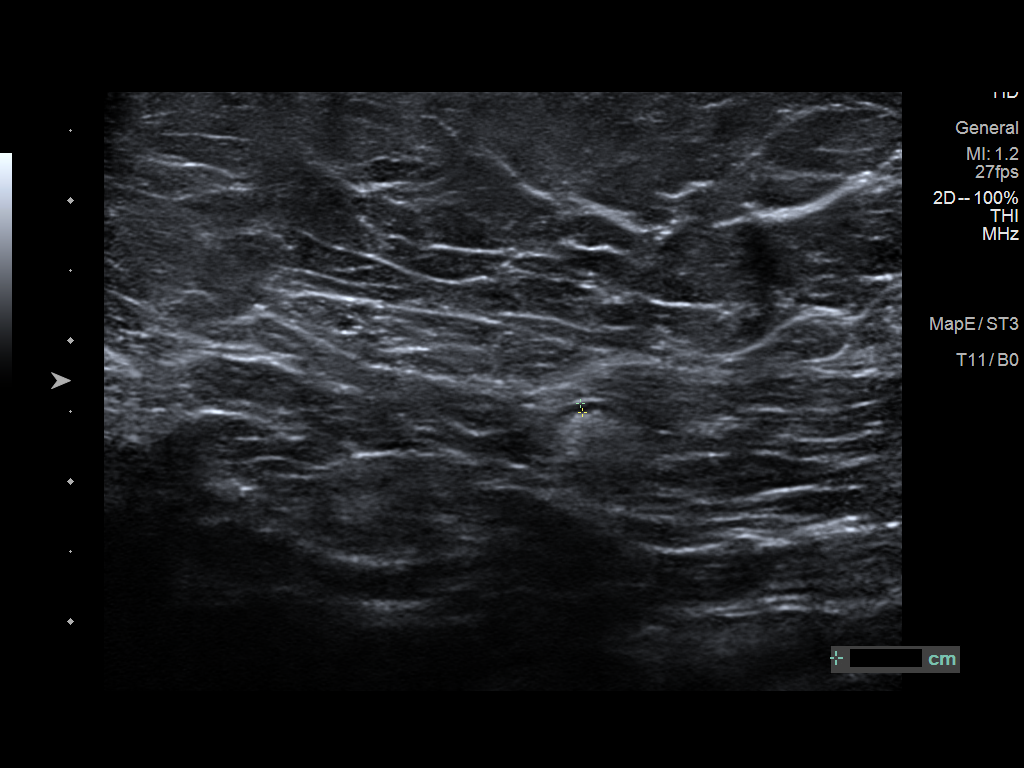

[4 of 4 positions shown; findings below may reference images not displayed]

ACR Breast Density Category b: There are scattered areas of
fibroglandular density.
FINDINGS: Full field and spot compression views of the RIGHT breast
demonstrate persistent distortion within the LOWER INNER RIGHT
breast, middle depth.

Targeted ultrasound is performed, showing no definite sonographic
correlate to the LOWER INNER RIGHT breast distortion identified
mammographically.

No abnormal RIGHT axillary lymph nodes are noted.
IMPRESSION: Persistent LOWER INNER RIGHT breast distortion. Tissue sampling is
recommended. No abnormal appearing RIGHT axillary lymph nodes.

RECOMMENDATION:
3D/stereotactic guided RIGHT breast biopsy, which will be scheduled.

I have discussed the findings and recommendations with the patient.
If applicable, a reminder letter will be sent to the patient
regarding the next appointment.

BI-RADS CATEGORY  4: Suspicious.

## 2022-05-02 DIAGNOSIS — M170 Bilateral primary osteoarthritis of knee: Secondary | ICD-10-CM | POA: Diagnosis not present

## 2022-05-11 DIAGNOSIS — M21162 Varus deformity, not elsewhere classified, left knee: Secondary | ICD-10-CM | POA: Diagnosis not present

## 2022-05-11 DIAGNOSIS — M1712 Unilateral primary osteoarthritis, left knee: Secondary | ICD-10-CM | POA: Diagnosis not present

## 2022-05-11 DIAGNOSIS — G8918 Other acute postprocedural pain: Secondary | ICD-10-CM | POA: Diagnosis not present

## 2022-05-23 DIAGNOSIS — M1712 Unilateral primary osteoarthritis, left knee: Secondary | ICD-10-CM | POA: Diagnosis not present

## 2022-06-14 DIAGNOSIS — M1711 Unilateral primary osteoarthritis, right knee: Secondary | ICD-10-CM | POA: Diagnosis not present

## 2022-06-21 DIAGNOSIS — M1711 Unilateral primary osteoarthritis, right knee: Secondary | ICD-10-CM | POA: Diagnosis not present

## 2022-07-03 DIAGNOSIS — M1711 Unilateral primary osteoarthritis, right knee: Secondary | ICD-10-CM | POA: Diagnosis not present

## 2022-09-04 DIAGNOSIS — M170 Bilateral primary osteoarthritis of knee: Secondary | ICD-10-CM | POA: Diagnosis not present

## 2022-12-12 ENCOUNTER — Other Ambulatory Visit: Payer: Self-pay | Admitting: General Surgery

## 2022-12-12 DIAGNOSIS — Z1231 Encounter for screening mammogram for malignant neoplasm of breast: Secondary | ICD-10-CM

## 2023-01-14 ENCOUNTER — Ambulatory Visit
Admission: RE | Admit: 2023-01-14 | Discharge: 2023-01-14 | Disposition: A | Discharge status: Home / Self Care | Payer: BC Managed Care – PPO | Source: Ambulatory Visit | Attending: General Surgery | Admitting: General Surgery

## 2023-01-14 DIAGNOSIS — Z1231 Encounter for screening mammogram for malignant neoplasm of breast: Secondary | ICD-10-CM

## 2023-04-09 DIAGNOSIS — M47816 Spondylosis without myelopathy or radiculopathy, lumbar region: Secondary | ICD-10-CM | POA: Diagnosis not present

## 2023-05-08 DIAGNOSIS — Z01419 Encounter for gynecological examination (general) (routine) without abnormal findings: Secondary | ICD-10-CM | POA: Diagnosis not present

## 2023-05-08 DIAGNOSIS — Z1151 Encounter for screening for human papillomavirus (HPV): Secondary | ICD-10-CM | POA: Diagnosis not present

## 2023-05-08 DIAGNOSIS — Z6827 Body mass index (BMI) 27.0-27.9, adult: Secondary | ICD-10-CM | POA: Diagnosis not present

## 2023-05-08 DIAGNOSIS — Z124 Encounter for screening for malignant neoplasm of cervix: Secondary | ICD-10-CM | POA: Diagnosis not present

## 2023-07-30 DIAGNOSIS — M1711 Unilateral primary osteoarthritis, right knee: Secondary | ICD-10-CM | POA: Diagnosis not present

## 2023-07-30 DIAGNOSIS — M170 Bilateral primary osteoarthritis of knee: Secondary | ICD-10-CM | POA: Diagnosis not present

## 2023-07-30 DIAGNOSIS — M1712 Unilateral primary osteoarthritis, left knee: Secondary | ICD-10-CM | POA: Diagnosis not present

## 2023-09-05 DIAGNOSIS — M1711 Unilateral primary osteoarthritis, right knee: Secondary | ICD-10-CM | POA: Diagnosis not present

## 2023-10-02 DIAGNOSIS — M62838 Other muscle spasm: Secondary | ICD-10-CM | POA: Diagnosis not present

## 2023-10-02 DIAGNOSIS — S199XXA Unspecified injury of neck, initial encounter: Secondary | ICD-10-CM | POA: Diagnosis not present

## 2023-10-02 DIAGNOSIS — S3992XA Unspecified injury of lower back, initial encounter: Secondary | ICD-10-CM | POA: Diagnosis not present

## 2023-10-08 DIAGNOSIS — M545 Low back pain, unspecified: Secondary | ICD-10-CM | POA: Diagnosis not present

## 2023-10-17 DIAGNOSIS — M1711 Unilateral primary osteoarthritis, right knee: Secondary | ICD-10-CM | POA: Diagnosis not present

## 2023-10-24 DIAGNOSIS — M1711 Unilateral primary osteoarthritis, right knee: Secondary | ICD-10-CM | POA: Diagnosis not present

## 2023-10-30 DIAGNOSIS — M6281 Muscle weakness (generalized): Secondary | ICD-10-CM | POA: Diagnosis not present

## 2023-10-30 DIAGNOSIS — M549 Dorsalgia, unspecified: Secondary | ICD-10-CM | POA: Diagnosis not present

## 2023-10-31 DIAGNOSIS — M1711 Unilateral primary osteoarthritis, right knee: Secondary | ICD-10-CM | POA: Diagnosis not present

## 2023-11-06 DIAGNOSIS — M549 Dorsalgia, unspecified: Secondary | ICD-10-CM | POA: Diagnosis not present

## 2023-11-06 DIAGNOSIS — M6281 Muscle weakness (generalized): Secondary | ICD-10-CM | POA: Diagnosis not present

## 2023-11-07 DIAGNOSIS — M545 Low back pain, unspecified: Secondary | ICD-10-CM | POA: Diagnosis not present

## 2023-11-08 DIAGNOSIS — M549 Dorsalgia, unspecified: Secondary | ICD-10-CM | POA: Diagnosis not present

## 2023-11-08 DIAGNOSIS — M6281 Muscle weakness (generalized): Secondary | ICD-10-CM | POA: Diagnosis not present

## 2023-11-13 DIAGNOSIS — M6281 Muscle weakness (generalized): Secondary | ICD-10-CM | POA: Diagnosis not present

## 2023-11-13 DIAGNOSIS — M549 Dorsalgia, unspecified: Secondary | ICD-10-CM | POA: Diagnosis not present

## 2023-11-14 DIAGNOSIS — M549 Dorsalgia, unspecified: Secondary | ICD-10-CM | POA: Diagnosis not present

## 2023-11-14 DIAGNOSIS — M6281 Muscle weakness (generalized): Secondary | ICD-10-CM | POA: Diagnosis not present

## 2023-11-19 DIAGNOSIS — M6281 Muscle weakness (generalized): Secondary | ICD-10-CM | POA: Diagnosis not present

## 2023-11-19 DIAGNOSIS — M549 Dorsalgia, unspecified: Secondary | ICD-10-CM | POA: Diagnosis not present

## 2023-11-25 DIAGNOSIS — M6281 Muscle weakness (generalized): Secondary | ICD-10-CM | POA: Diagnosis not present

## 2023-11-25 DIAGNOSIS — M549 Dorsalgia, unspecified: Secondary | ICD-10-CM | POA: Diagnosis not present

## 2023-11-27 DIAGNOSIS — M6281 Muscle weakness (generalized): Secondary | ICD-10-CM | POA: Diagnosis not present

## 2023-11-27 DIAGNOSIS — M549 Dorsalgia, unspecified: Secondary | ICD-10-CM | POA: Diagnosis not present

## 2023-12-02 ENCOUNTER — Other Ambulatory Visit: Payer: Self-pay | Admitting: Obstetrics and Gynecology

## 2023-12-02 DIAGNOSIS — Z1231 Encounter for screening mammogram for malignant neoplasm of breast: Secondary | ICD-10-CM

## 2023-12-03 DIAGNOSIS — M6281 Muscle weakness (generalized): Secondary | ICD-10-CM | POA: Diagnosis not present

## 2023-12-03 DIAGNOSIS — M549 Dorsalgia, unspecified: Secondary | ICD-10-CM | POA: Diagnosis not present

## 2023-12-05 DIAGNOSIS — M549 Dorsalgia, unspecified: Secondary | ICD-10-CM | POA: Diagnosis not present

## 2023-12-05 DIAGNOSIS — M6281 Muscle weakness (generalized): Secondary | ICD-10-CM | POA: Diagnosis not present

## 2023-12-06 DIAGNOSIS — M545 Low back pain, unspecified: Secondary | ICD-10-CM | POA: Diagnosis not present

## 2023-12-16 DIAGNOSIS — M545 Low back pain, unspecified: Secondary | ICD-10-CM | POA: Diagnosis not present

## 2023-12-30 DIAGNOSIS — M47816 Spondylosis without myelopathy or radiculopathy, lumbar region: Secondary | ICD-10-CM | POA: Diagnosis not present

## 2024-01-08 DIAGNOSIS — M47816 Spondylosis without myelopathy or radiculopathy, lumbar region: Secondary | ICD-10-CM | POA: Diagnosis not present

## 2024-01-15 ENCOUNTER — Ambulatory Visit

## 2024-01-31 ENCOUNTER — Ambulatory Visit

## 2024-02-11 DIAGNOSIS — M47816 Spondylosis without myelopathy or radiculopathy, lumbar region: Secondary | ICD-10-CM | POA: Diagnosis not present

## 2024-02-18 ENCOUNTER — Ambulatory Visit
Admission: RE | Admit: 2024-02-18 | Discharge: 2024-02-18 | Disposition: A | Discharge status: Home / Self Care | Source: Ambulatory Visit | Attending: Obstetrics and Gynecology | Admitting: Obstetrics and Gynecology

## 2024-02-18 DIAGNOSIS — Z1231 Encounter for screening mammogram for malignant neoplasm of breast: Secondary | ICD-10-CM

## 2024-02-28 DIAGNOSIS — M47816 Spondylosis without myelopathy or radiculopathy, lumbar region: Secondary | ICD-10-CM | POA: Diagnosis not present

## 2024-04-21 DIAGNOSIS — M1711 Unilateral primary osteoarthritis, right knee: Secondary | ICD-10-CM | POA: Diagnosis not present

## 2024-04-28 DIAGNOSIS — M47817 Spondylosis without myelopathy or radiculopathy, lumbosacral region: Secondary | ICD-10-CM | POA: Diagnosis not present
